# Patient Record
Sex: Female | Born: 1965 | Race: Black or African American | Hispanic: No | Smoking: Never smoker
Health system: Southern US, Community
[De-identification: ages and names within clinical notes are randomized; demographics above are authoritative.]

## PROBLEM LIST (undated history)

## (undated) DIAGNOSIS — K219 Gastro-esophageal reflux disease without esophagitis: Secondary | ICD-10-CM

## (undated) HISTORY — PX: WRIST SURGERY: SHX841

## (undated) HISTORY — PX: TUBAL LIGATION: SHX77

---

## 1998-06-10 ENCOUNTER — Emergency Department (HOSPITAL_COMMUNITY): Admission: EM | Admit: 1998-06-10 | Discharge: 1998-06-10 | Payer: Self-pay | Admitting: Emergency Medicine

## 1999-03-04 ENCOUNTER — Encounter: Payer: Self-pay | Admitting: Emergency Medicine

## 1999-03-04 ENCOUNTER — Emergency Department (HOSPITAL_COMMUNITY): Admission: EM | Admit: 1999-03-04 | Discharge: 1999-03-04 | Payer: Self-pay | Admitting: Emergency Medicine

## 2001-02-22 ENCOUNTER — Encounter: Payer: Self-pay | Admitting: Emergency Medicine

## 2001-02-22 ENCOUNTER — Emergency Department (HOSPITAL_COMMUNITY): Admission: EM | Admit: 2001-02-22 | Discharge: 2001-02-22 | Payer: Self-pay | Admitting: Emergency Medicine

## 2003-10-21 ENCOUNTER — Emergency Department (HOSPITAL_COMMUNITY): Admission: EM | Admit: 2003-10-21 | Discharge: 2003-10-21 | Payer: Self-pay | Admitting: Emergency Medicine

## 2004-02-29 ENCOUNTER — Emergency Department (HOSPITAL_COMMUNITY): Admission: EM | Admit: 2004-02-29 | Discharge: 2004-02-29 | Payer: Self-pay | Admitting: Emergency Medicine

## 2004-10-25 ENCOUNTER — Emergency Department (HOSPITAL_COMMUNITY): Admission: EM | Admit: 2004-10-25 | Discharge: 2004-10-25 | Payer: Self-pay | Admitting: Emergency Medicine

## 2005-11-13 ENCOUNTER — Emergency Department (HOSPITAL_COMMUNITY): Admission: EM | Admit: 2005-11-13 | Discharge: 2005-11-13 | Payer: Self-pay | Admitting: Emergency Medicine

## 2010-05-11 ENCOUNTER — Emergency Department (HOSPITAL_COMMUNITY): Admission: EM | Admit: 2010-05-11 | Discharge: 2010-05-11 | Payer: Self-pay | Admitting: Emergency Medicine

## 2011-02-23 LAB — CBC
MCHC: 34 g/dL (ref 30.0–36.0)
MCV: 82.7 fL (ref 78.0–100.0)
Platelets: 195 10*3/uL (ref 150–400)
RBC: 4.26 MIL/uL (ref 3.87–5.11)

## 2011-02-23 LAB — DIFFERENTIAL
Lymphs Abs: 4.1 10*3/uL — ABNORMAL HIGH (ref 0.7–4.0)
Monocytes Absolute: 0.5 10*3/uL (ref 0.1–1.0)
Monocytes Relative: 5 % (ref 3–12)
Neutrophils Relative %: 49 % (ref 43–77)

## 2011-02-23 LAB — URINALYSIS, ROUTINE W REFLEX MICROSCOPIC
Protein, ur: NEGATIVE mg/dL
Specific Gravity, Urine: >1.03 — ABNORMAL HIGH (ref 1.005–1.030)
Urobilinogen, UA: 0.2 mg/dL (ref 0.0–1.0)

## 2011-02-23 LAB — URINE MICROSCOPIC-ADD ON

## 2011-02-23 LAB — BASIC METABOLIC PANEL
BUN: 12 mg/dL (ref 6–23)
CO2: 24 mEq/L (ref 19–32)
Calcium: 8.6 mg/dL (ref 8.4–10.5)
GFR calc Af Amer: >60 mL/min (ref >60)
GFR calc non Af Amer: >60 mL/min (ref >60)

## 2011-02-23 LAB — POCT CARDIAC MARKERS: CKMB, poc: <1 ng/mL — ABNORMAL LOW (ref 1.0–8.0)

## 2011-11-02 ENCOUNTER — Emergency Department (HOSPITAL_COMMUNITY)
Admission: EM | Admit: 2011-11-02 | Discharge: 2011-11-03 | Disposition: A | Discharge status: Home / Self Care | Payer: Self-pay | Attending: Emergency Medicine | Admitting: Emergency Medicine

## 2011-11-02 ENCOUNTER — Emergency Department (HOSPITAL_COMMUNITY): Payer: Self-pay

## 2011-11-02 DIAGNOSIS — R0789 Other chest pain: Secondary | ICD-10-CM

## 2011-11-02 DIAGNOSIS — R071 Chest pain on breathing: Secondary | ICD-10-CM | POA: Insufficient documentation

## 2011-11-02 LAB — BASIC METABOLIC PANEL
BUN: 7 mg/dL (ref 6–23)
Glucose, Bld: 102 mg/dL — ABNORMAL HIGH (ref 70–99)
Sodium: 138 mEq/L (ref 135–145)

## 2011-11-02 LAB — DIFFERENTIAL
Eosinophils Absolute: 0.2 10*3/uL (ref 0.0–0.7)
Eosinophils Relative: 2 % (ref 0–5)
Lymphocytes Relative: 52 % — ABNORMAL HIGH (ref 12–46)
Lymphs Abs: 5.5 10*3/uL — ABNORMAL HIGH (ref 0.7–4.0)
Monocytes Absolute: 0.6 10*3/uL (ref 0.1–1.0)
Neutro Abs: 4.2 10*3/uL (ref 1.7–7.7)

## 2011-11-02 LAB — CBC
Hemoglobin: 11.6 g/dL — ABNORMAL LOW (ref 12.0–15.0)
MCH: 26.7 pg (ref 26.0–34.0)
MCV: 82.3 fL (ref 78.0–100.0)
Platelets: 180 10*3/uL (ref 150–400)
RDW: 14.9 % (ref 11.5–15.5)
WBC: 10.5 10*3/uL (ref 4.0–10.5)

## 2011-11-02 MED ORDER — KETOROLAC TROMETHAMINE 30 MG/ML IJ SOLN
30.0000 mg | Freq: Once | INTRAMUSCULAR | Status: AC
  Administered 2011-11-02: 30 mg via INTRAVENOUS
  Filled 2011-11-02: qty 1

## 2011-11-02 MED ORDER — ONDANSETRON HCL 4 MG/2ML IJ SOLN
4.0000 mg | Freq: Once | INTRAMUSCULAR | Status: AC
  Administered 2011-11-02: 4 mg via INTRAVENOUS
  Filled 2011-11-02: qty 2

## 2011-11-02 MED ORDER — FENTANYL CITRATE 0.05 MG/ML IJ SOLN
50.0000 ug | Freq: Once | INTRAMUSCULAR | Status: AC
  Administered 2011-11-02: 50 ug via INTRAVENOUS
  Filled 2011-11-02: qty 2

## 2011-11-02 MED ORDER — SODIUM CHLORIDE 0.9 % IV BOLUS (SEPSIS): 1000.0000 mL | Freq: Once | INTRAVENOUS | Status: DC

## 2011-11-02 NOTE — ED Provider Notes (Signed)
Scribed for Sunnie Nielsen, MD, the patient was seen in room APA05/[facility]05 . This chart was scribed by Ellie Lunch.   CSN: [id number] Arrival date & time: 11/02/2011 10:29 PM   First MD Initiated Contact with Patient 11/02/11 2304      Chief Complaint  Patient presents with  . Chest Pain    (Consider location/radiation/quality/duration/timing/severity/associated sxs/prior treatment) HPI Pt seen at 11:12 PM Jessica Johns is a 45 y.o. female who presents to the Emergency Department complaining of 2 days of sudden onset and gradually worsening chest pain. Pain began after Pt's father passed away on 05/08/2023 and has been constant since onset. Pain is described as a sharp, pulling pain. Pain is located under left breast. Pain is associated with SOB and nausea. Pt denies diaphoresis, leg pain or swelling. Pt treated with 2 Ib profen about 5 hours ago with mild improvement. Pt denies any recent or trauma or injury to chest. Pt denies h/o of heart issues. Pt has family history of heart issues.   History reviewed. No pertinent past medical history.  History reviewed. No pertinent past surgical history.  No family history on file.  History  Substance Use Topics  . Smoking status: Never Smoker   . Smokeless tobacco: Not on file  . Alcohol Use: No    Review of Systems 10 Systems reviewed and are negative for acute change except as noted in the HPI.   Allergies  Review of patient's allergies indicates no known allergies.  Home Medications   Current Outpatient Rx  Name Route Sig Dispense Refill  . IBUPROFEN 200 MG PO TABS Oral Take 400 mg by mouth once as needed. For pain       BP 133/65  Pulse 92  Temp(Src) 98.5 F (36.9 C) (Oral)  Resp 22  Ht 5' (1.524 m)  Wt 220 lb (99.791 kg)  BMI 42.97 kg/m2  SpO2 100%  LMP 10/26/2011  Physical Exam  Nursing note and vitals reviewed. Constitutional: She is oriented to person, place, and time. She appears well-developed and  well-nourished.  HENT:  Head: Normocephalic and atraumatic.  Eyes: Conjunctivae and EOM are normal.  Neck: Neck supple.  Cardiovascular: Normal rate and regular rhythm.   Pulmonary/Chest: Effort normal. No respiratory distress.       reproducible left chest wall tenderness  Abdominal: Soft. There is no tenderness.  Musculoskeletal:       Calves nontender.  Negative holman sign No chords.   Neurological: She is alert and oriented to person, place, and time.  Skin: Skin is warm and dry.  Psychiatric: She has a normal mood and affect.    ED Course  Procedures (including critical care time)  Labs Reviewed  BASIC METABOLIC PANEL - Abnormal; Notable for the following:    Glucose, Bld 102 (*)    All other components within normal limits  CBC  DIFFERENTIAL  I-STAT TROPONIN I  D-DIMER, QUANTITATIVE   Dg Chest Portable 1 View  11/02/2011  *RADIOLOGY REPORT*  Clinical Data: Chest pain.  PORTABLE CHEST - 1 VIEW  Comparison: 05/11/2010  Findings: The heart size is normal.  No edema or infiltrates.  No visualized pleural effusion.  The bony thorax is within normal limits.  IMPRESSION: No active disease.  Original Report Authenticated By: Reola Calkins, M.D.   ED MEDICATIONS Medications  fentaNYL (SUBLIMAZE) injection 50 mcg   sodium chloride 0.9 % bolus 1,000 mL   ketorolac (TORADOL) 30 MG/ML injection 30 mg (30 mg Intravenous Given 11/02/11 2348)  ondansetron [facility]) injection 4 mg (4 mg Intravenous Given 11/02/11 2347)    Nursing notes reviewed.  Pulse ox adequate room air  Chest x-ray and labs reviewed as above.   Date: 11/03/2011  Rate: 87  Rhythm: normal sinus rhythm  QRS Axis: normal  Intervals: normal  ST/T Wave abnormalities: nonspecific ST changes  Conduction Disutrbances:none  Narrative Interpretation: Baseline wander present  Old EKG Reviewed: none available    MDM  Presentation suggests chest wall pain with reproducible tenderness. Screening EKG  reviewed. Workup as above does not reveal other etiologies for symptoms. Patient still for discharge home with followup primary care. Patient states understanding strict return precautions for any worsening condition.   I personally performed the services described in this documentation, which was scribed in my presence. The recorded information has been reviewed and considered.          Sunnie Nielsen, MD 11/03/11 [phone]

## 2011-11-02 NOTE — ED Notes (Signed)
Began having cp after father passed away on sat, pain worse tonight.

## 2011-11-02 NOTE — ED Notes (Signed)
Pain medications given as ordered.  Pt rates chest pain 8/10 at this time. Family in room. No further needs voiced. Will continue to monitor.

## 2011-11-03 LAB — POCT I-STAT TROPONIN I

## 2011-11-03 MED ORDER — IBUPROFEN 800 MG PO TABS: 800.0000 mg | ORAL_TABLET | Freq: Three times a day (TID) | ORAL | Status: AC

## 2011-11-03 MED ORDER — FENTANYL CITRATE 0.05 MG/ML IJ SOLN
50.0000 ug | Freq: Once | INTRAMUSCULAR | Status: AC
  Administered 2011-11-03: 50 ug via INTRAVENOUS
  Filled 2011-11-03: qty 2

## 2011-11-03 MED ORDER — HYDROCODONE-ACETAMINOPHEN 7.5-500 MG/15ML PO SOLN: 7.5000 mL | Freq: Three times a day (TID) | ORAL | Status: AC | PRN

## 2011-11-03 NOTE — ED Notes (Signed)
Pt called to room and states chest pain is "sharp and worse" rating pain 10/10.  MD informed.  New orders received for Fentanyl IV and to repeat Troponin in three hours from first.

## 2011-11-03 NOTE — ED Notes (Signed)
Pt states pain is better rating pain 7/10. No needs voiced. Family remains at bedside.

## 2013-03-17 ENCOUNTER — Encounter (HOSPITAL_COMMUNITY): Payer: Self-pay | Admitting: *Deleted

## 2013-03-17 ENCOUNTER — Inpatient Hospital Stay (HOSPITAL_COMMUNITY)
Admission: EM | Admit: 2013-03-17 | Discharge: 2013-03-24 | DRG: 863 | Disposition: A | Discharge status: Home / Self Care | Payer: Medicaid Other | Attending: Internal Medicine | Admitting: Internal Medicine

## 2013-03-17 ENCOUNTER — Emergency Department (HOSPITAL_COMMUNITY): Payer: Medicaid Other

## 2013-03-17 DIAGNOSIS — L03211 Cellulitis of face: Secondary | ICD-10-CM | POA: Diagnosis present

## 2013-03-17 DIAGNOSIS — L0201 Cutaneous abscess of face: Secondary | ICD-10-CM | POA: Diagnosis present

## 2013-03-17 DIAGNOSIS — Z833 Family history of diabetes mellitus: Secondary | ICD-10-CM

## 2013-03-17 DIAGNOSIS — T8140XA Infection following a procedure, unspecified, initial encounter: Principal | ICD-10-CM | POA: Diagnosis present

## 2013-03-17 DIAGNOSIS — R110 Nausea: Secondary | ICD-10-CM | POA: Diagnosis not present

## 2013-03-17 DIAGNOSIS — Z6841 Body Mass Index (BMI) 40.0 and over, adult: Secondary | ICD-10-CM

## 2013-03-17 DIAGNOSIS — E876 Hypokalemia: Secondary | ICD-10-CM | POA: Diagnosis present

## 2013-03-17 DIAGNOSIS — E669 Obesity, unspecified: Secondary | ICD-10-CM | POA: Diagnosis present

## 2013-03-17 DIAGNOSIS — R259 Unspecified abnormal involuntary movements: Secondary | ICD-10-CM | POA: Diagnosis present

## 2013-03-17 DIAGNOSIS — Y838 Other surgical procedures as the cause of abnormal reaction of the patient, or of later complication, without mention of misadventure at the time of the procedure: Secondary | ICD-10-CM | POA: Diagnosis present

## 2013-03-17 DIAGNOSIS — R1310 Dysphagia, unspecified: Secondary | ICD-10-CM | POA: Diagnosis present

## 2013-03-17 DIAGNOSIS — R609 Edema, unspecified: Secondary | ICD-10-CM | POA: Diagnosis not present

## 2013-03-17 DIAGNOSIS — K08409 Partial loss of teeth, unspecified cause, unspecified class: Secondary | ICD-10-CM | POA: Diagnosis present

## 2013-03-17 LAB — POCT I-STAT, CHEM 8
BUN: 9 mg/dL (ref 6–23)
Calcium, Ion: 1.13 mmol/L (ref 1.12–1.23)
Chloride: 108 mEq/L (ref 96–112)
Glucose, Bld: 96 mg/dL (ref 70–99)
TCO2: 29 mmol/L (ref 0–100)

## 2013-03-17 LAB — CBC WITH DIFFERENTIAL/PLATELET
Eosinophils Relative: 1 % (ref 0–5)
HCT: 31.6 % — ABNORMAL LOW (ref 36.0–46.0)
Hemoglobin: 10.7 g/dL — ABNORMAL LOW (ref 12.0–15.0)
Lymphocytes Relative: 32 % (ref 12–46)
MCHC: 33.9 g/dL (ref 30.0–36.0)
MCV: 79.6 fL (ref 78.0–100.0)
Monocytes Absolute: 0.6 10*3/uL (ref 0.1–1.0)
Monocytes Relative: 6 % (ref 3–12)
Neutro Abs: 6.8 10*3/uL (ref 1.7–7.7)
RDW: 15.8 % — ABNORMAL HIGH (ref 11.5–15.5)
WBC: 11.2 10*3/uL — ABNORMAL HIGH (ref 4.0–10.5)

## 2013-03-17 MED ORDER — HYDROMORPHONE HCL PF 1 MG/ML IJ SOLN
1.0000 mg | INTRAMUSCULAR | Status: DC | PRN
  Administered 2013-03-17: 1 mg via INTRAVENOUS
  Filled 2013-03-17: qty 1

## 2013-03-17 MED ORDER — ONDANSETRON HCL 4 MG/2ML IJ SOLN
4.0000 mg | Freq: Four times a day (QID) | INTRAMUSCULAR | Status: DC | PRN
  Administered 2013-03-17 – 2013-03-19 (×5): 4 mg via INTRAVENOUS
  Filled 2013-03-17 (×5): qty 2

## 2013-03-17 MED ORDER — HYDROMORPHONE HCL PF 1 MG/ML IJ SOLN
1.0000 mg | Freq: Once | INTRAMUSCULAR | Status: AC
  Administered 2013-03-17: 1 mg via INTRAVENOUS
  Filled 2013-03-17: qty 1

## 2013-03-17 MED ORDER — SODIUM CHLORIDE 0.9 % IV SOLN
3.0000 g | Freq: Four times a day (QID) | INTRAVENOUS | Status: DC
  Administered 2013-03-17 – 2013-03-23 (×23): 3 g via INTRAVENOUS
  Filled 2013-03-17 (×23): qty 3

## 2013-03-17 MED ORDER — HYDROMORPHONE HCL PF 1 MG/ML IJ SOLN
1.0000 mg | INTRAMUSCULAR | Status: DC | PRN
  Administered 2013-03-17: 1 mg via INTRAVENOUS
  Administered 2013-03-17 – 2013-03-18 (×3): 2 mg via INTRAVENOUS
  Administered 2013-03-18: 1 mg via INTRAVENOUS
  Administered 2013-03-18: 2 mg via INTRAVENOUS
  Administered 2013-03-19 (×2): 1 mg via INTRAVENOUS
  Filled 2013-03-17: qty 1
  Filled 2013-03-17 (×2): qty 2
  Filled 2013-03-17: qty 1
  Filled 2013-03-17: qty 2
  Filled 2013-03-17 (×2): qty 1
  Filled 2013-03-17: qty 2

## 2013-03-17 MED ORDER — KETOROLAC TROMETHAMINE 30 MG/ML IJ SOLN
30.0000 mg | Freq: Four times a day (QID) | INTRAMUSCULAR | Status: AC | PRN
  Administered 2013-03-17 – 2013-03-22 (×15): 30 mg via INTRAVENOUS
  Filled 2013-03-17 (×15): qty 1

## 2013-03-17 MED ORDER — SODIUM CHLORIDE 0.9 % IV SOLN
3.0000 g | Freq: Once | INTRAVENOUS | Status: AC
  Administered 2013-03-17: 3 g via INTRAVENOUS
  Filled 2013-03-17: qty 3

## 2013-03-17 MED ORDER — POTASSIUM CHLORIDE IN NACL 40-0.9 MEQ/L-% IV SOLN
INTRAVENOUS | Status: DC
  Administered 2013-03-17 – 2013-03-22 (×6): via INTRAVENOUS

## 2013-03-17 MED ORDER — ONDANSETRON HCL 4 MG PO TABS
4.0000 mg | ORAL_TABLET | Freq: Four times a day (QID) | ORAL | Status: DC | PRN
  Filled 2013-03-17: qty 1

## 2013-03-17 MED ORDER — IOHEXOL 300 MG/ML  SOLN
75.0000 mL | Freq: Once | INTRAMUSCULAR | Status: AC | PRN
  Administered 2013-03-17: 75 mL via INTRAVENOUS

## 2013-03-17 MED ORDER — HEPARIN SODIUM (PORCINE) 5000 UNIT/ML IJ SOLN
5000.0000 [IU] | Freq: Three times a day (TID) | INTRAMUSCULAR | Status: DC
  Administered 2013-03-17 – 2013-03-24 (×20): 5000 [IU] via SUBCUTANEOUS
  Filled 2013-03-17 (×21): qty 1

## 2013-03-17 MED ORDER — ONDANSETRON HCL 4 MG/2ML IJ SOLN
4.0000 mg | Freq: Once | INTRAMUSCULAR | Status: AC
  Administered 2013-03-17: 4 mg via INTRAVENOUS
  Filled 2013-03-17: qty 2

## 2013-03-17 NOTE — H&P (Signed)
Hospital Admission Note Date: 03/17/2013  Patient name: Jessica Johns Medical record number: [id number] Date of birth: August 28, 1966 Age: 47 y.o. Gender: female [facility]CP: [facility]  Attending physician: Christiane Ha, MD  Chief Complaint: facial pain and swelling  History of Present Illness:  Jessica Johns is an 47 y.o. female who presents with worsening right face and neck swelling.  She had 4 wisdom teeth extracted 2 days ago. The swelling became much worse today. She is unable to handle her own saliva, certainly difficulty swallowing pills. She's had no fevers or chills. No shortness of breath. CT of the face and neck show no abscess. The pain has become very severe, that she has had to alternate Vicodin and ibuprofen, though has been unable take anything today.  History reviewed. No pertinent past medical history.  Meds: Vicodin, ibuprofen  Allergies: Review of patient's allergies indicates no known allergies. History   Social History  . Marital Status: Single    Spouse Name: N/A    Number of Children: N/A  . Years of Education: N/A   Occupational History  . Not on file.   Social History Main Topics  . Smoking status: Never Smoker   . Smokeless tobacco: Not on file  . Alcohol Use: No  . Drug Use: No  . Sexually Active: Yes    Birth Control/ Protection: None   Other Topics Concern  . Not on file   Social History Narrative  . No narrative on file   FH:  Both parents have diabetes  Past Surgical History  Procedure Laterality Date  . Tubal ligation      Review of Systems: Systems reviewed and as per HPI, otherwise negative.  Physical Exam: Blood pressure 124/71, pulse 91, temperature 98.9 F (37.2 C), temperature source Oral, resp. rate 18, height 5' (1.524 m), weight 102.059 kg (225 lb), last menstrual period 02/27/2013, SpO2 99.00%. BP 124/71  Pulse 91  Temp(Src) 98.9 F (37.2 C) (Oral)  Resp 18  Ht 5' (1.524 m)  Wt 102.059 kg (225 lb)  BMI  43.94 kg/m2  SpO2 99%  LMP 02/27/2013  General Appearance:    Alert, uncomfortable appearing   Head:    Normocephalic, without obvious abnormality, atraumatic.right face and neck are indurated, exquisitely tender and slightly erythematous   Eyes:    PERRL, conjunctiva/corneas clear, EOM's intact, fundi    benign, both eyes  Ears:    Normal TM's and external ear canals, both ears  Nose:   Nares normal, septum midline, mucosa normal, no drainage    or sinus tenderness  Throat:   moist mucous membranes. Patient is unable to open her mouth wide enough to fully examine all teeth and oropharynx. No blood or drainage noted   Neck:   Supple, symmetrical, trachea midline, no adenopathy;    thyroid:  no enlargement/tenderness/nodules; no carotid   bruit or JVD  Back:     Symmetric, no curvature, ROM normal, no CVA tenderness  Lungs:     Clear to auscultation bilaterally, respirations unlabored  Chest Wall:    No tenderness or deformity   Heart:    Regular rate and rhythm, S1 and S2 normal, no murmur, rub   or gallop     Abdomen:     Soft, non-tender, bowel sounds active all four quadrants,    no masses, no organomegaly        Extremities:   Extremities normal, atraumatic, no cyanosis or edema  Pulses:   2+ and symmetric  all extremities  Skin:   Skin color, texture, turgor normal, no rashes or lesions  Lymph nodes:   Cervical, supraclavicular, and axillary nodes normal  Neurologic:   CNII-XII intact, normal strength, sensation and reflexes    throughout     Psychiatric: Anxious appearing  Lab results: Basic Metabolic Panel:  Recent Labs  81/19/14 1202  NA 140  K 3.4*  CL 108  GLUCOSE 96  BUN 9  CREATININE 0.80   Liver Function Tests: No results found for this basename: AST, ALT, ALKPHOS, BILITOT, PROT, ALBUMIN,  in the last 72 hours No results found for this basename: LIPASE, AMYLASE,  in the last 72 hours No results found for this basename: AMMONIA,  in the last 72  hours CBC:  Recent Labs  03/17/13 1151 03/17/13 1202  WBC 11.2*  --   NEUTROABS 6.8  --   HGB 10.7* 11.9*  HCT 31.6* 35.0*  MCV 79.6  --   PLT 192  --    Imaging results:  Ct Soft Tissue Neck W Contrast  03/17/2013  *RADIOLOGY REPORT*  Clinical Data:  Right facial pain and swelling after tooth extraction  CT MAXILLOFACIAL WITH CONTRAST  Technique:  Multidetector CT imaging of the maxillofacial structure s was performed with intravenous contrast. Multiplanar CT image reconstructions were als o generated.  A small metallic BB was placed on the right temple in order to reliably differentiate right from left.  Contrast: 75mL OMNIPAQUE IOHEXOL 300 MG/ML  SOLN  Comparison:   None.  Findings:  Sinuses, middle ears and mastoids are clear.  There is evidence of recent extraction of all four wisdom teeth.  There is no bony complications seen relative to that on either side.  There is no discernible facial swelling on the left.  On the right, there is a nonspecific swelling and edema of the soft tissues of the right face consistent with cellulitis.  No evidence of a drainable abscess at this moment.  Submandibular lymph nodes are slightly enlarged consistent with reactive change.  No suppuration.  No deep space involvement is identified.  IMPRESSION: Recent extraction of all four wisdom tooth.  Soft tissue swelling of the right side of the face consistent with cellulitis.  No evidence of drainable abscess.  CT NECK WITH CONTRAST  Technique:  Multidetector CT imaging of the neck was performed using the standard protocol following the bolus administration of intravenous contrast.  Contrast: 75mL OMNIPAQUE IOHEXOL 300 MG/ML  SOLN  Comparison:   None  Findings: Below the region of facial swelling on the right related to the recent tooth extraction and felt to be consistent with cellulitis, there is no worrisome finding in the neck.  Images in the lower neck are markedly degraded because of the patient's size. No  upper mediastinal pathology is seen.  No evidence of suppurative lymph nodes.  No extension of inflammatory changes into the neck proper.  Thyroid gland appears unremarkable.  Vascular structures are unremarkable.  IMPRESSION: No extension of the right face inflammatory process into the neck proper.   Original Report Authenticated By: Paulina Fusi, M.D.    Ct Maxillofacial W/cm  03/17/2013  *RADIOLOGY REPORT*  Clinical Data:  Right facial pain and swelling after tooth extraction  CT MAXILLOFACIAL WITH CONTRAST  Technique:  Multidetector CT imaging of the maxillofacial structure s was performed with intravenous contrast. Multiplanar CT image reconstructions were als o generated.  A small metallic BB was placed on the right temple in order to reliably differentiate right  from left.  Contrast: 75mL OMNIPAQUE IOHEXOL 300 MG/ML  SOLN  Comparison:   None.  Findings:  Sinuses, middle ears and mastoids are clear.  There is evidence of recent extraction of all four wisdom teeth.  There is no bony complications seen relative to that on either side.  There is no discernible facial swelling on the left.  On the right, there is a nonspecific swelling and edema of the soft tissues of the right face consistent with cellulitis.  No evidence of a drainable abscess at this moment.  Submandibular lymph nodes are slightly enlarged consistent with reactive change.  No suppuration.  No deep space involvement is identified.  IMPRESSION: Recent extraction of all four wisdom tooth.  Soft tissue swelling of the right side of the face consistent with cellulitis.  No evidence of drainable abscess.  CT NECK WITH CONTRAST  Technique:  Multidetector CT imaging of the neck was performed using the standard protocol following the bolus administration of intravenous contrast.  Contrast: 75mL OMNIPAQUE IOHEXOL 300 MG/ML  SOLN  Comparison:   None  Findings: Below the region of facial swelling on the right related to the recent tooth extraction and  felt to be consistent with cellulitis, there is no worrisome finding in the neck.  Images in the lower neck are markedly degraded because of the patient's size. No upper mediastinal pathology is seen.  No evidence of suppurative lymph nodes.  No extension of inflammatory changes into the neck proper.  Thyroid gland appears unremarkable.  Vascular structures are unremarkable.  IMPRESSION: No extension of the right face inflammatory process into the neck proper.   Original Report Authenticated By: Paulina Fusi, M.D.     Assessment & Plan: Principal Problem:   Facial cellulitis Active Problems:   S/P tooth extraction   Hypokalemia  As patient is unable to take anything by mouth, she will be admitted for IV antibiotics, pain control and observation. Replete potassium IV. For now ice chips and sips of liquids.  Legacy Carrender L 03/17/2013, 3:05 PM

## 2013-03-17 NOTE — ED Notes (Signed)
Pt had all four wisdom teeth pulled 2 days ago; significant swelling and pain to right jaw and face; c/o pain radiating to ear and difficulty swallowing at times

## 2013-03-17 NOTE — ED Provider Notes (Signed)
History     CSN: [id number]  Arrival date & time 03/17/13  1116   First MD Initiated Contact with Patient 03/17/13 1128      Chief Complaint  Patient presents with  . Dental Pain  . Facial Swelling     Patient is a 47 y.o. female presenting with tooth pain. The history is provided by the patient.  Dental PainThe primary symptoms include mouth pain. Primary symptoms do not include fever. The symptoms began 2 days ago. The symptoms are worsening. The symptoms are new. The symptoms occur constantly.  Additional symptoms include: trismus and facial swelling. Additional symptoms do not include: drooling.  nothing improves her symptoms Palpation of face worsens pain  Pt presents with right facial swelling She reports wisdom teeth extraction 2 days ago.  She had some swelling postop, but then this morning she woke up with worsened pain/swelling.  She reports it is difficult to open mouth.  She is having some difficulty swallowing  PMH - none  Past Surgical History  Procedure Laterality Date  . Tubal ligation      No family history on file.  History  Substance Use Topics  . Smoking status: Never Smoker   . Smokeless tobacco: Not on file  . Alcohol Use: No    OB History   Grav Para Term Preterm Abortions TAB SAB Ect Mult Living                  Review of Systems  Constitutional: Negative for fever.  HENT: Positive for facial swelling and dental problem. Negative for drooling and neck stiffness.   Eyes: Negative for visual disturbance.  Respiratory: Negative for chest tightness.   Cardiovascular: Negative for chest pain.  Gastrointestinal: Negative for abdominal pain.  Musculoskeletal: Negative for back pain.  Neurological: Negative for weakness.  Psychiatric/Behavioral: Negative for agitation.  All other systems reviewed and are negative.    Allergies  Review of patient's allergies indicates no known allergies.  Home Medications   Current Outpatient Rx  Name   Route  Sig  Dispense  Refill  . ibuprofen (ADVIL,MOTRIN) 200 MG tablet   Oral   Take 400 mg by mouth once as needed. For pain            BP 127/64  Pulse 95  Temp(Src) 98.9 F (37.2 C) (Oral)  Resp 20  Ht 5' (1.524 m)  Wt 225 lb (102.059 kg)  BMI 43.94 kg/m2  SpO2 98%  LMP 02/27/2013  Physical Exam CONSTITUTIONAL: Well developed/well nourished HEAD: Normocephalic/atraumatic EYES: EOMI/PERRL, no proptosis ENMT: mild trismus noted.  Diffuse tenderness along gingiva of right mandible.  She has significant external facial swelling that starts around right mandible and extends towards maxilla.  She has some swelling below right mandible.  No crepitance.  No tenderness over anterior neck.   No stridor is noted NECK: supple no meningeal signs SPINE:entire spine nontender CV: S1/S2 noted, no murmurs/rubs/gallops noted LUNGS: Lungs are clear to auscultation bilaterally, no apparent distress ABDOMEN: soft, nontender, no rebound or guarding GU:no cva tenderness NEURO: Pt is awake/alert, moves all extremitiesx4 EXTREMITIES: pulses normal, full ROM SKIN: warm, color normal PSYCH: no abnormalities of mood noted  ED Course  Procedures   Labs Reviewed  CBC WITH DIFFERENTIAL   12:04 PM Pt is here for facial swelling since dental extraction by dr Barbette Merino in [place] Most of the swelling is on right mandibular region.  She can fully range neck and no stridor.  She is handling  secretions well Will obtain CT imaging.  IV antibiotics started 12:59 PM Pt reports she feels improved. CT pending at this time  2:21 PM I spoke to dr Barbette Merino, her oral surgeon We discussed CT findings.  If she can take PO she can be started on oral augmentin Pt still with pain on swallowing.  She is otherwise no distress.  Per CT imaging, cellulitis only, no identifiable abscess and no airway involvement.  Her tongue appears normal She will require further antibiotic dosing and concern that she will not be able to  take PO at home Will admit D/w dr Kerry Hough, will admit to hospital  MDM  Nursing notes including past medical history and social history reviewed and considered in documentation Labs/vital reviewed and considered         Joya Gaskins, MD 03/17/13 1424

## 2013-03-17 NOTE — ED Notes (Signed)
Pt c/o dental pain and right side facial swelling since yesterday. Pt had all 4 wisdom teeth removed on 4/9 and called surgeon after she noticed swelling. Pt states swelling became worse this morning and she was advised to come to ED for evaluation.

## 2013-03-18 DIAGNOSIS — L03211 Cellulitis of face: Secondary | ICD-10-CM

## 2013-03-18 DIAGNOSIS — K08409 Partial loss of teeth, unspecified cause, unspecified class: Secondary | ICD-10-CM

## 2013-03-18 DIAGNOSIS — E876 Hypokalemia: Secondary | ICD-10-CM

## 2013-03-18 DIAGNOSIS — K08109 Complete loss of teeth, unspecified cause, unspecified class: Secondary | ICD-10-CM

## 2013-03-18 NOTE — Progress Notes (Signed)
Subjective: Able to swallow small amounts of Jell-O and ginger ale mixed with water. Still having difficulty opening her mouth. Still with much pain. No difficulty breathing.  Objective: Vital signs in last 24 hours: Filed Vitals:   03/17/13 1123 03/17/13 1327 03/17/13 2230  BP: 127/64 124/71 118/81  Pulse: 95 91 88  Temp: 98.9 F (37.2 C)  97.9 F (36.6 C)  TempSrc: Oral    Resp: 20 18 20   Height: 5' (1.524 m)    Weight: 102.059 kg (225 lb)    SpO2: 98% 99% 100%   Weight change:   Intake/Output Summary (Last 24 hours) at 03/18/13 0858 Last data filed at 03/17/13 2300  Gross per 24 hour  Intake    400 ml  Output      0 ml  Net    400 ml   General: Uncomfortable. Nontoxic. HEENT: Right face and neck swelling about the same. Able to open her mouth slightly wider today. No fluctuance or drainage. Moist mucous membranes. Neck: No stridor Lungs clear to auscultation bilaterally without wheeze rhonchi or rales  Lab Results: Basic Metabolic Panel:  Recent Labs Lab 03/17/13 1202  NA 140  K 3.4*  CL 108  GLUCOSE 96  BUN 9  CREATININE 0.80   Liver Function Tests: No results found for this basename: AST, ALT, ALKPHOS, BILITOT, PROT, ALBUMIN,  in the last 168 hours No results found for this basename: LIPASE, AMYLASE,  in the last 168 hours No results found for this basename: AMMONIA,  in the last 168 hours CBC:  Recent Labs Lab 03/17/13 1151 03/17/13 1202  WBC 11.2*  --   NEUTROABS 6.8  --   HGB 10.7* 11.9*  HCT 31.6* 35.0*  MCV 79.6  --   PLT 192  --   Studies/Results: Ct Soft Tissue Neck W Contrast  03/17/2013  *RADIOLOGY REPORT*  Clinical Data:  Right facial pain and swelling after tooth extraction  CT MAXILLOFACIAL WITH CONTRAST  Technique:  Multidetector CT imaging of the maxillofacial structure s was performed with intravenous contrast. Multiplanar CT image reconstructions were als o generated.  A small metallic BB was placed on the right temple in order to  reliably differentiate right from left.  Contrast: 75mL OMNIPAQUE IOHEXOL 300 MG/ML  SOLN  Comparison:   None.  Findings:  Sinuses, middle ears and mastoids are clear.  There is evidence of recent extraction of all four wisdom teeth.  There is no bony complications seen relative to that on either side.  There is no discernible facial swelling on the left.  On the right, there is a nonspecific swelling and edema of the soft tissues of the right face consistent with cellulitis.  No evidence of a drainable abscess at this moment.  Submandibular lymph nodes are slightly enlarged consistent with reactive change.  No suppuration.  No deep space involvement is identified.  IMPRESSION: Recent extraction of all four wisdom tooth.  Soft tissue swelling of the right side of the face consistent with cellulitis.  No evidence of drainable abscess.  CT NECK WITH CONTRAST  Technique:  Multidetector CT imaging of the neck was performed using the standard protocol following the bolus administration of intravenous contrast.  Contrast: 75mL OMNIPAQUE IOHEXOL 300 MG/ML  SOLN  Comparison:   None  Findings: Below the region of facial swelling on the right related to the recent tooth extraction and felt to be consistent with cellulitis, there is no worrisome finding in the neck.  Images in the lower  neck are markedly degraded because of the patient's size. No upper mediastinal pathology is seen.  No evidence of suppurative lymph nodes.  No extension of inflammatory changes into the neck proper.  Thyroid gland appears unremarkable.  Vascular structures are unremarkable.  IMPRESSION: No extension of the right face inflammatory process into the neck proper.   Original Report Authenticated By: Paulina Fusi, M.D.    Ct Maxillofacial W/cm  03/17/2013  *RADIOLOGY REPORT*  Clinical Data:  Right facial pain and swelling after tooth extraction  CT MAXILLOFACIAL WITH CONTRAST  Technique:  Multidetector CT imaging of the maxillofacial structure s  was performed with intravenous contrast. Multiplanar CT image reconstructions were als o generated.  A small metallic BB was placed on the right temple in order to reliably differentiate right from left.  Contrast: 75mL OMNIPAQUE IOHEXOL 300 MG/ML  SOLN  Comparison:   None.  Findings:  Sinuses, middle ears and mastoids are clear.  There is evidence of recent extraction of all four wisdom teeth.  There is no bony complications seen relative to that on either side.  There is no discernible facial swelling on the left.  On the right, there is a nonspecific swelling and edema of the soft tissues of the right face consistent with cellulitis.  No evidence of a drainable abscess at this moment.  Submandibular lymph nodes are slightly enlarged consistent with reactive change.  No suppuration.  No deep space involvement is identified.  IMPRESSION: Recent extraction of all four wisdom tooth.  Soft tissue swelling of the right side of the face consistent with cellulitis.  No evidence of drainable abscess.  CT NECK WITH CONTRAST  Technique:  Multidetector CT imaging of the neck was performed using the standard protocol following the bolus administration of intravenous contrast.  Contrast: 75mL OMNIPAQUE IOHEXOL 300 MG/ML  SOLN  Comparison:   None  Findings: Below the region of facial swelling on the right related to the recent tooth extraction and felt to be consistent with cellulitis, there is no worrisome finding in the neck.  Images in the lower neck are markedly degraded because of the patient's size. No upper mediastinal pathology is seen.  No evidence of suppurative lymph nodes.  No extension of inflammatory changes into the neck proper.  Thyroid gland appears unremarkable.  Vascular structures are unremarkable.  IMPRESSION: No extension of the right face inflammatory process into the neck proper.   Original Report Authenticated By: Paulina Fusi, M.D.    Scheduled Meds: . ampicillin-sulbactam (UNASYN) IV  3 g  Intravenous Q6H  . heparin  5,000 Units Subcutaneous Q8H   Continuous Infusions: . 0.9 % NaCl with KCl 40 mEq / L 75 mL/hr at 03/17/13 1542   PRN Meds:.HYDROmorphone (DILAUDID) injection, ketorolac, ondansetron (ZOFRAN) IV, ondansetron Assessment/Plan: Principal Problem:   Facial cellulitis with significant trismus and difficulty swallowing. Continue Unasyn, NSAIDs and Dilaudid. Add ice packs. Slightly improved today. Discharge when able to swallow pills and keep hydrated. Changed to inpatient. Active Problems:   S/P tooth extraction, 4 wisdom teeth by Dr. Ocie Doyne on the ninth. He is aware of patient's admission per   Hypokalemia: Check labs in the morning the    LOS: 1 day   Braxtyn Dorff L 03/18/2013, 8:58 AM

## 2013-03-19 DIAGNOSIS — L0201 Cutaneous abscess of face: Secondary | ICD-10-CM

## 2013-03-19 DIAGNOSIS — L03211 Cellulitis of face: Secondary | ICD-10-CM

## 2013-03-19 LAB — CBC WITH DIFFERENTIAL/PLATELET
Basophils Absolute: 0 10*3/uL (ref 0.0–0.1)
Basophils Relative: 0 % (ref 0–1)
Eosinophils Absolute: 0.1 10*3/uL (ref 0.0–0.7)
Eosinophils Relative: 2 % (ref 0–5)
HCT: 32.6 % — ABNORMAL LOW (ref 36.0–46.0)
Hemoglobin: 10.7 g/dL — ABNORMAL LOW (ref 12.0–15.0)
Lymphocytes Relative: 46 % (ref 12–46)
Lymphs Abs: 3 10*3/uL (ref 0.7–4.0)
MCH: 26.9 pg (ref 26.0–34.0)
MCHC: 32.8 g/dL (ref 30.0–36.0)
MCV: 81.9 fL (ref 78.0–100.0)
Monocytes Absolute: 0.4 10*3/uL (ref 0.1–1.0)
Monocytes Relative: 5 % (ref 3–12)
Neutro Abs: 3 10*3/uL (ref 1.7–7.7)
Neutrophils Relative %: 46 % (ref 43–77)
Platelets: 208 10*3/uL (ref 150–400)
RBC: 3.98 MIL/uL (ref 3.87–5.11)
RDW: 15.7 % — ABNORMAL HIGH (ref 11.5–15.5)
WBC: 6.5 10*3/uL (ref 4.0–10.5)

## 2013-03-19 LAB — BASIC METABOLIC PANEL
Calcium: 8.4 mg/dL (ref 8.4–10.5)
GFR calc Af Amer: >90 mL/min (ref >90)
GFR calc non Af Amer: >90 mL/min (ref >90)
Potassium: 4 mEq/L (ref 3.5–5.1)
Sodium: 133 mEq/L — ABNORMAL LOW (ref 135–145)

## 2013-03-19 MED ORDER — DIPHENHYDRAMINE HCL 12.5 MG/5ML PO ELIX: 12.5000 mg | ORAL_SOLUTION | Freq: Four times a day (QID) | ORAL | Status: DC | PRN

## 2013-03-19 MED ORDER — ONDANSETRON HCL 4 MG/2ML IJ SOLN
4.0000 mg | INTRAMUSCULAR | Status: DC | PRN
  Administered 2013-03-19 – 2013-03-21 (×6): 4 mg via INTRAVENOUS
  Filled 2013-03-19 (×6): qty 2

## 2013-03-19 MED ORDER — SODIUM CHLORIDE 0.9 % IJ SOLN: 9.0000 mL | INTRAMUSCULAR | Status: DC | PRN

## 2013-03-19 MED ORDER — HYDROMORPHONE 0.3 MG/ML IV SOLN
INTRAVENOUS | Status: DC
  Administered 2013-03-19: 15:00:00 via INTRAVENOUS
  Administered 2013-03-19: 2.7 mg via INTRAVENOUS
  Administered 2013-03-19 – 2013-03-20 (×2): 1.8 mg via INTRAVENOUS
  Administered 2013-03-20: 03:00:00 via INTRAVENOUS
  Filled 2013-03-19 (×2): qty 25

## 2013-03-19 MED ORDER — NALOXONE HCL 0.4 MG/ML IJ SOLN: 0.4000 mg | INTRAMUSCULAR | Status: DC | PRN

## 2013-03-19 MED ORDER — ONDANSETRON HCL 4 MG PO TABS: 4.0000 mg | ORAL_TABLET | ORAL | Status: DC | PRN

## 2013-03-19 MED ORDER — DIPHENHYDRAMINE HCL 50 MG/ML IJ SOLN
12.5000 mg | Freq: Four times a day (QID) | INTRAMUSCULAR | Status: DC | PRN
  Administered 2013-03-19: 12.5 mg via INTRAVENOUS
  Filled 2013-03-19: qty 1

## 2013-03-19 NOTE — Progress Notes (Signed)
[facility] PROGRESS NOTE  Jessica Johns VHQ:[id number] DOB: Jul 30, 1966 DOA: 03/17/2013 PCP: No primary provider on file.  Assessment/Plan: Principal Problem:   Facial cellulitis: Secondary to recent tooth extraction. Continue IV antibiotics this medicine for pain and nausea. Active Problems:   S/P tooth extraction   Hypokalemia: Replaced Edema: Likely secondary to third spacing from poor by mouth intake  Code Status: Full code Family Communication: Plan discussed with patient at bedside. Will leave message for son. Disposition Plan: Home in a few days when able to eat   Consultants:  None  Procedures:  Status post dental extraction done on 4/9, prior to admission  Antibiotics:  Unasyn day 3  HPI/Subjective: Patient feeling minimally better today. Still having significant amount pain. Not able to swallow yet. It was able to take minimal amount of Jell-O.  Objective: Filed Vitals:   03/17/13 2230 03/18/13 1400 03/18/13 2030 03/18/13 2207  BP: 118/81 112/69  124/49  Pulse: 88 73 74 88  Temp: 97.9 F (36.6 C) 98.6 F (37 C)  98.6 F (37 C)  TempSrc:  Oral  Oral  Resp: 20 20 20 20   Height:      Weight:      SpO2: 100% 100% 99% 100%    Intake/Output Summary (Last 24 hours) at 03/19/13 1356 Last data filed at 03/18/13 1500  Gross per 24 hour  Intake   1200 ml  Output      0 ml  Net   1200 ml   Filed Weights   03/17/13 1123  Weight: 102.059 kg (225 lb)    Exam:   General:  Alert and oriented x3, mild distress secondary to pain, minimally able to open mouth  Cardiovascular: Regular rate and rhythm, S1-S2  Respiratory: Clear auscultation bilaterally  Abdomen: Soft, nontender, obese, positive bowel sounds  Musculoskeletal: Trace pitting edema lower extremities   Data Reviewed: Basic Metabolic Panel:  Recent Labs Lab 03/17/13 1202 03/19/13 0617  NA 140 133*  K 3.4* 4.0  CL 108 100  CO2  --  25  GLUCOSE 96 94  BUN 9 5*  CREATININE 0.80  0.64  CALCIUM  --  8.4   CBC:  Recent Labs Lab 03/17/13 1151 03/17/13 1202 03/19/13 0617  WBC 11.2*  --  6.5  NEUTROABS 6.8  --  3.0  HGB 10.7* 11.9* 10.7*  HCT 31.6* 35.0* 32.6*  MCV 79.6  --  81.9  PLT 192  --  208     Studies: No results found.  Scheduled Meds: . ampicillin-sulbactam (UNASYN) IV  3 g Intravenous Q6H  . heparin  5,000 Units Subcutaneous Q8H   Continuous Infusions: . 0.9 % NaCl with KCl 40 mEq / L 75 mL/hr at 03/19/13 0254    Principal Problem:   Facial cellulitis Active Problems:   S/P tooth extraction   Hypokalemia    Time spent: 20 minutes    Hollice Espy  [facility] Pager [phone]. If 7PM-7AM, please contact night-coverage at [email or web address], password [facility] 03/19/2013, 1:56 PM  LOS: 2 days

## 2013-03-20 MED ORDER — HYDROMORPHONE 0.3 MG/ML IV SOLN
INTRAVENOUS | Status: DC
  Administered 2013-03-21 (×2): 0.9 mg via INTRAVENOUS
  Administered 2013-03-21: 03:00:00 via INTRAVENOUS
  Filled 2013-03-20: qty 25

## 2013-03-20 MED ORDER — LIDOCAINE VISCOUS 2 % MT SOLN
20.0000 mL | Freq: Four times a day (QID) | OROMUCOSAL | Status: DC | PRN
Start: 2013-03-20 — End: 2013-03-24
  Administered 2013-03-20 – 2013-03-23 (×2): 20 mL via OROMUCOSAL
  Filled 2013-03-20: qty 30
  Filled 2013-03-20: qty 15

## 2013-03-20 MED ORDER — SODIUM CHLORIDE 0.9 % IJ SOLN: 9.0000 mL | INTRAMUSCULAR | Status: DC | PRN

## 2013-03-20 MED ORDER — NALOXONE HCL 0.4 MG/ML IJ SOLN: 0.4000 mg | INTRAMUSCULAR | Status: DC | PRN

## 2013-03-20 NOTE — Plan of Care (Signed)
Problem: Phase II Progression Outcomes Goal: Tolerating diet Outcome: Progressing Pt tolerating diet very poorly,MD ordered viscous lidocaine for oral pain control

## 2013-03-20 NOTE — Progress Notes (Signed)
UR Chart Review Completed  

## 2013-03-20 NOTE — Care Management Note (Signed)
    Page 1 of 1   03/23/2013     1:40:33 PM   CARE MANAGEMENT NOTE 03/23/2013  Patient:  Jessica Johns, Jessica Johns   Account Number:  [id number]  Date Initiated:  03/20/2013  Documentation initiated by:  Rosemary Holms  Subjective/Objective Assessment:   Pt admitted from home where she lives with her son. Due to facial cellulitis from tooth extraction, pt can not eat.     Action/Plan:   Anticipated DC Date:  03/23/2013   Anticipated DC Plan:  HOME/SELF CARE      DC Planning Services  CM consult      Choice offered to / List presented to:             Status of service:  Completed, signed off Medicare Important Message given?   (If response is "NO", the following Medicare IM given date fields will be blank) Date Medicare IM given:   Date Additional Medicare IM given:    Discharge Disposition:  HOME/SELF CARE  Per UR Regulation:    If discussed at Long Length of Stay Meetings, dates discussed:    Comments:  03/23/13 Rosemary Holms RN BSN CM Pt able to swallow medication per RN. Anticipate DC later today. No needs identified.  03/22/13 Rosemary Holms RN BSN CM Improving, hope DC tomorrow home  03/20/13 Rosemary Holms RN BSN CM

## 2013-03-20 NOTE — Progress Notes (Signed)
[facility] PROGRESS NOTE  Jessica Johns ZOX:[id number] DOB: 1966-02-19 DOA: 03/17/2013 PCP: No primary provider on file.  Assessment/Plan: Principal Problem:   Facial cellulitis: Secondary to recent tooth extraction. Continue IV antibiotics.  Swelling significantly reduced.  Dilaudid PCA started & did help with pain, but the strength contributing to increased nausea.  Active Problems:   S/P tooth extraction   Hypokalemia: Replaced Edema: Likely secondary to third spacing from poor by mouth intake  Code Status: Full code Family Communication: Plan discussed with patient at bedside. Will leave message for son. Disposition Plan: Home in a few days when able to eat   Consultants:  None  Procedures:  Status post dental extraction done on 4/9, prior to admission  Antibiotics:  Unasyn day 4  HPI/Subjective: Much less swelling.  Still not able to swallow.  Pain better controlled with PCA, but medicine causing sig nausea.  Objective: Filed Vitals:   03/19/13 2336 03/20/13 0230 03/20/13 0419 03/20/13 0700  BP:  122/70  115/75  Pulse:  73  71  Temp:  98 F (36.7 C)  98 F (36.7 C)  TempSrc:  Oral  Oral  Resp: 11 10 10    Height:      Weight:      SpO2: 100% 100% 100% 100%    Intake/Output Summary (Last 24 hours) at 03/20/13 0936 Last data filed at 03/20/13 0858  Gross per 24 hour  Intake     40 ml  Output      0 ml  Net     40 ml   Filed Weights   03/17/13 1123  Weight: 102.059 kg (225 lb)    Exam:   General:  Alert and oriented x3, mild distress secondary to pain, able to open mouth  Cardiovascular: Regular rate and rhythm, S1-S2  Respiratory: Clear auscultation bilaterally  Abdomen: Soft, nontender, obese, positive bowel sounds  Musculoskeletal: Trace pitting edema lower extremities   Data Reviewed: Basic Metabolic Panel:  Recent Labs Lab 03/17/13 1202 03/19/13 0617  NA 140 133*  K 3.4* 4.0  CL 108 100  CO2  --  25  GLUCOSE 96 94  BUN  9 5*  CREATININE 0.80 0.64  CALCIUM  --  8.4   CBC:  Recent Labs Lab 03/17/13 1151 03/17/13 1202 03/19/13 0617  WBC 11.2*  --  6.5  NEUTROABS 6.8  --  3.0  HGB 10.7* 11.9* 10.7*  HCT 31.6* 35.0* 32.6*  MCV 79.6  --  81.9  PLT 192  --  208     Studies: No results found.  Scheduled Meds: . ampicillin-sulbactam (UNASYN) IV  3 g Intravenous Q6H  . heparin  5,000 Units Subcutaneous Q8H  . HYDROmorphone PCA 0.3 mg/mL   Intravenous Q4H   Continuous Infusions: . 0.9 % NaCl with KCl 40 mEq / L 75 mL/hr at 03/19/13 0254    Principal Problem:   Facial cellulitis Active Problems:   S/P tooth extraction   Hypokalemia    Time spent: 20 minutes    Hollice Espy  [facility] Pager [phone]. If 7PM-7AM, please contact night-coverage at [email or web address], password [facility] 03/20/2013, 9:36 AM  LOS: 3 days

## 2013-03-21 ENCOUNTER — Inpatient Hospital Stay (HOSPITAL_COMMUNITY): Payer: Medicaid Other

## 2013-03-21 DIAGNOSIS — R110 Nausea: Secondary | ICD-10-CM | POA: Diagnosis not present

## 2013-03-21 DIAGNOSIS — E669 Obesity, unspecified: Secondary | ICD-10-CM | POA: Diagnosis present

## 2013-03-21 LAB — BASIC METABOLIC PANEL
BUN: 5 mg/dL — ABNORMAL LOW (ref 6–23)
Calcium: 8.9 mg/dL (ref 8.4–10.5)
Creatinine, Ser: 0.69 mg/dL (ref 0.50–1.10)
GFR calc Af Amer: >90 mL/min (ref >90)
GFR calc non Af Amer: >90 mL/min (ref >90)
Glucose, Bld: 96 mg/dL (ref 70–99)
Potassium: 3.5 mEq/L (ref 3.5–5.1)

## 2013-03-21 MED ORDER — DIPHENHYDRAMINE HCL 50 MG/ML IJ SOLN: 12.5000 mg | Freq: Four times a day (QID) | INTRAMUSCULAR | Status: DC | PRN

## 2013-03-21 MED ORDER — IOHEXOL 300 MG/ML  SOLN
75.0000 mL | Freq: Once | INTRAMUSCULAR | Status: AC | PRN
  Administered 2013-03-21: 75 mL via INTRAVENOUS

## 2013-03-21 MED ORDER — ONDANSETRON HCL 4 MG/2ML IJ SOLN
4.0000 mg | Freq: Four times a day (QID) | INTRAMUSCULAR | Status: DC | PRN
  Administered 2013-03-21 – 2013-03-22 (×3): 4 mg via INTRAVENOUS
  Filled 2013-03-21 (×3): qty 2

## 2013-03-21 MED ORDER — DIPHENHYDRAMINE HCL 12.5 MG/5ML PO ELIX: 12.5000 mg | ORAL_SOLUTION | Freq: Four times a day (QID) | ORAL | Status: DC | PRN

## 2013-03-21 MED ORDER — MORPHINE SULFATE (PF) 1 MG/ML IV SOLN
INTRAVENOUS | Status: DC
  Administered 2013-03-21 (×2): via INTRAVENOUS
  Administered 2013-03-21: 9 mg via INTRAVENOUS
  Administered 2013-03-22: 3 mg via INTRAVENOUS
  Filled 2013-03-21 (×2): qty 25

## 2013-03-21 MED ORDER — SODIUM CHLORIDE 0.9 % IJ SOLN: 9.0000 mL | INTRAMUSCULAR | Status: DC | PRN

## 2013-03-21 MED ORDER — NALOXONE HCL 0.4 MG/ML IJ SOLN: 0.4000 mg | INTRAMUSCULAR | Status: DC | PRN

## 2013-03-21 NOTE — Progress Notes (Signed)
[facility] PROGRESS NOTE  LAYANN BLUETT HQI:[id number] DOB: 1966-04-13 DOA: 03/17/2013 PCP: No primary provider on file.  Assessment/Plan: Principal Problem:   Facial cellulitis: Secondary to recent tooth extraction. Continue IV antibiotics.  A swelling itself appears to continue to improve. However, patient states today she is feeling worse. Increasing pain and decreasing ability to open her mouth. Still some painful swallowing. Dilaudid PCA decreased to reduced strength, which does help with pain-that gives her significant nausea. We'll check repeat CT maxillofacial.  Active Problems:   S/P tooth extraction   Hypokalemia: Replaced Edema: Likely secondary to third spacing from poor by mouth intake  Code Status: Full code Family Communication: Plan discussed with patient at bedside.  Disposition Plan: Home in a few days when able to eat   Consultants:  None  Procedures:  Status post dental extraction done on 4/9, prior to admission  Antibiotics:  Unasyn day 5  HPI/Subjective: However, patient states today she is feeling worse. Increasing pain and decreasing ability to open her mouth. Still some painful swallowing.  Objective: Filed Vitals:   03/21/13 0318 03/21/13 0430 03/21/13 0730 03/21/13 0916  BP:  162/76  146/74  Pulse:  71  74  Temp:  98.4 F (36.9 C)  98.7 F (37.1 C)  TempSrc:  Oral  Oral  Resp: 18 18 18 15   Height:      Weight:      SpO2: 100% 97% 97% 100%    Intake/Output Summary (Last 24 hours) at 03/21/13 1220 Last data filed at 03/21/13 1033  Gross per 24 hour  Intake    120 ml  Output      0 ml  Net    120 ml   Filed Weights   03/17/13 1123  Weight: 102.059 kg (225 lb)    Exam:   General:  Alert and oriented x3, mild distress secondary to pain, pain when trying to open her mouth. Not able to visualize tongue or mouth very well  Cardiovascular: Regular rate and rhythm, S1-S2  Respiratory: Clear auscultation  bilaterally  Abdomen: Soft, nontender, obese, positive bowel sounds  Musculoskeletal: Trace pitting edema lower extremities   Data Reviewed: Basic Metabolic Panel:  Recent Labs Lab 03/17/13 1202 03/19/13 0617 03/21/13 0505  NA 140 133* 137  K 3.4* 4.0 3.5  CL 108 100 101  CO2  --  25 26  GLUCOSE 96 94 96  BUN 9 5* 5*  CREATININE 0.80 0.64 0.69  CALCIUM  --  8.4 8.9   CBC:  Recent Labs Lab 03/17/13 1151 03/17/13 1202 03/19/13 0617  WBC 11.2*  --  6.5  NEUTROABS 6.8  --  3.0  HGB 10.7* 11.9* 10.7*  HCT 31.6* 35.0* 32.6*  MCV 79.6  --  81.9  PLT 192  --  208     Studies: No results found.  Scheduled Meds: . ampicillin-sulbactam (UNASYN) IV  3 g Intravenous Q6H  . heparin  5,000 Units Subcutaneous Q8H  . HYDROmorphone PCA 0.3 mg/mL   Intravenous Q4H   Continuous Infusions: . 0.9 % NaCl with KCl 40 mEq / L 75 mL/hr at 03/20/13 1144    Principal Problem:   Facial cellulitis Active Problems:   S/P tooth extraction   Hypokalemia    Time spent: 20 minutes    Hollice Espy  [facility] Pager [phone]. If 7PM-7AM, please contact night-coverage at [email or web address], password [facility] 03/21/2013, 12:20 PM  LOS: 4 days

## 2013-03-22 DIAGNOSIS — E669 Obesity, unspecified: Secondary | ICD-10-CM

## 2013-03-22 MED ORDER — OXYCODONE HCL 5 MG PO TABS
5.0000 mg | ORAL_TABLET | ORAL | Status: DC | PRN
  Administered 2013-03-22 – 2013-03-23 (×4): 5 mg via ORAL
  Filled 2013-03-22 (×7): qty 1

## 2013-03-22 MED ORDER — MORPHINE SULFATE 2 MG/ML IJ SOLN
2.0000 mg | INTRAMUSCULAR | Status: DC | PRN
  Administered 2013-03-22 – 2013-03-23 (×4): 2 mg via INTRAVENOUS
  Filled 2013-03-22 (×4): qty 1

## 2013-03-22 NOTE — Progress Notes (Signed)
     Subjective: This lady feels somewhat better today and her jaw is less swollen and painful. She is having difficulty opening her mouth to some degree. She is on a PCA pump!           Physical Exam: Blood pressure 95/55, pulse 74, temperature 97.9 F (36.6 C), temperature source Oral, resp. rate 13, height 5' (1.524 m), weight 102.059 kg (225 lb), last menstrual period 03/20/2013, SpO2 100.00%. She looks systemically well. Is not toxic or septic. Her right face is somewhat swollen and tender on palpation. Lung fields are clear. She is alert and orientated.   Investigations:     Basic Metabolic Panel:  Recent Labs  29/52/84 0505  NA 137  K 3.5  CL 101  CO2 26  GLUCOSE 96  BUN 5*  CREATININE 0.69  CALCIUM 8.9     Ct Maxillofacial W/cm  03/21/2013  *RADIOLOGY REPORT*  Clinical Data: Recent wisdom tooth extraction.  Facial cellulitis. On IV antibiotics.  CT MAXILLOFACIAL WITH CONTRAST  Technique:  Multidetector CT imaging of the maxillofacial structures was performed with intravenous contrast. Multiplanar CT image reconstructions were also generated.  Contrast: 75mL OMNIPAQUE IOHEXOL 300 MG/ML  SOLN  Comparison: 03/17/13  Findings: The patient has had recent extraction of all four wisdom teeth.  Fluid and air can be seen in the sockets of the mandible and maxilla.  There is improved air in the parasymphyseal region on the right adjacent to the mandible. No worrisome osseous findings. No paranasal sinus disease.  There is moderate cellulitis which appears slightly improved over the right face.  No submandibular or submental collection is seen.  Scattered reactive mild separate lymph nodes.  IMPRESSION: Overall slight improvement of right facial cellulitis. No visible abscess.   Original Report Authenticated By: Davonna Belling, M.D.       Medications: I have reviewed the patient's current medications.  Impression: 1. Right facial cellulitis, status post tooth extraction.  Improving. 2. Obesity.     Plan: 1. Discontinue morphine PCA pump. IV morphine when necessary and oxycodone orally as needed. 2. Encourage oral intake. 3. Mobilize.     LOS: 5 days   Wilson Singer Pager [phone]  03/22/2013, 9:56 AM

## 2013-03-22 NOTE — Plan of Care (Signed)
Problem: Phase II Progression Outcomes Goal: Tolerating diet Outcome: Progressing Pt stated that she was hungry today.  Requesting more substantial food other than liquid. Advanced to full liquid diet.

## 2013-03-23 LAB — CBC
MCH: 27.2 pg (ref 26.0–34.0)
MCHC: 33.8 g/dL (ref 30.0–36.0)
Platelets: 222 10*3/uL (ref 150–400)
RDW: 15.2 % (ref 11.5–15.5)

## 2013-03-23 LAB — COMPREHENSIVE METABOLIC PANEL
ALT: 19 U/L (ref 0–35)
Albumin: 3.1 g/dL — ABNORMAL LOW (ref 3.5–5.2)
Alkaline Phosphatase: 42 U/L (ref 39–117)
Calcium: 9.1 mg/dL (ref 8.4–10.5)
GFR calc Af Amer: >90 mL/min (ref >90)
Glucose, Bld: 89 mg/dL (ref 70–99)
Potassium: 3.8 mEq/L (ref 3.5–5.1)
Sodium: 138 mEq/L (ref 135–145)
Total Protein: 6.6 g/dL (ref 6.0–8.3)

## 2013-03-23 MED ORDER — ONDANSETRON HCL 4 MG PO TABS: 4.0000 mg | ORAL_TABLET | Freq: Three times a day (TID) | ORAL | Status: DC | PRN

## 2013-03-23 MED ORDER — ONDANSETRON HCL 4 MG PO TABS: 4.0000 mg | ORAL_TABLET | Freq: Four times a day (QID) | ORAL | Status: DC | PRN

## 2013-03-23 MED ORDER — OXYCODONE HCL 5 MG PO TABS
5.0000 mg | ORAL_TABLET | ORAL | Status: DC
  Administered 2013-03-23 – 2013-03-24 (×6): 5 mg via ORAL
  Filled 2013-03-23 (×4): qty 1

## 2013-03-23 MED ORDER — AMOXICILLIN-POT CLAVULANATE 875-125 MG PO TABS
1.0000 | ORAL_TABLET | Freq: Two times a day (BID) | ORAL | Status: DC
  Administered 2013-03-23: 1 via ORAL
  Filled 2013-03-23: qty 1

## 2013-03-23 NOTE — Progress Notes (Signed)
     Subjective: This lady does not like to take  pain medicines. She says it is painful to swallow, in particular to open her jaw which is understandable. She is able to swallow liquids.           Physical Exam: Blood pressure 125/74, pulse 65, temperature 98.4 F (36.9 C), temperature source Oral, resp. rate 16, height 5' (1.524 m), weight 102.059 kg (225 lb), last menstrual period 03/20/2013, SpO2 96.00%. She looks systemically well. Is not toxic or septic. Her right face is somewhat swollen and tender on palpation. Lung fields are clear. She is alert and orientated.   Investigations:     Basic Metabolic Panel:  Recent Labs  16/10/96 0505 03/23/13 0558  NA 137 138  K 3.5 3.8  CL 101 100  CO2 26 26  GLUCOSE 96 89  BUN 5* 4*  CREATININE 0.69 0.74  CALCIUM 8.9 9.1     Ct Maxillofacial W/cm  03/21/2013  *RADIOLOGY REPORT*  Clinical Data: Recent wisdom tooth extraction.  Facial cellulitis. On IV antibiotics.  CT MAXILLOFACIAL WITH CONTRAST  Technique:  Multidetector CT imaging of the maxillofacial structures was performed with intravenous contrast. Multiplanar CT image reconstructions were also generated.  Contrast: 75mL OMNIPAQUE IOHEXOL 300 MG/ML  SOLN  Comparison: 03/17/13  Findings: The patient has had recent extraction of all four wisdom teeth.  Fluid and air can be seen in the sockets of the mandible and maxilla.  There is improved air in the parasymphyseal region on the right adjacent to the mandible. No worrisome osseous findings. No paranasal sinus disease.  There is moderate cellulitis which appears slightly improved over the right face.  No submandibular or submental collection is seen.  Scattered reactive mild separate lymph nodes.  IMPRESSION: Overall slight improvement of right facial cellulitis. No visible abscess.   Original Report Authenticated By: Davonna Belling, M.D.       Medications: I have reviewed the patient's current medications.  Impression: 1.  Right facial cellulitis, status post tooth extraction. Improving slowly. 2. Obesity.     Plan: 1. Start scheduled oxycodone. 2. Continue with intravenous antibiotics. 3. Encourage oral intake as much as possible.     LOS: 6 days   Wilson Singer Pager [phone]  03/23/2013, 8:34 AM

## 2013-03-23 NOTE — Progress Notes (Signed)
UR Chart Review Completed  

## 2013-03-23 NOTE — Progress Notes (Signed)
Pt lost IV access this morning.  Dr. Karilyn Cota notified.  Stated that he will be switching her antibiotics to po.

## 2013-03-23 NOTE — Progress Notes (Addendum)
Spoke with pt regarding pain medicine regimen.  Stated that she did feel that it was helping.  Stated that she did feel like she was improving.  Stated she can open her mouth more and it is easier to talk now.  Stated she wanted to keep her current pain medicine regimen right now.  Did not feel she need additional pain medicine.

## 2013-03-24 LAB — BASIC METABOLIC PANEL
BUN: 5 mg/dL — ABNORMAL LOW (ref 6–23)
Chloride: 99 mEq/L (ref 96–112)
Creatinine, Ser: 0.78 mg/dL (ref 0.50–1.10)
GFR calc non Af Amer: >90 mL/min (ref >90)
Glucose, Bld: 96 mg/dL (ref 70–99)
Potassium: 3.6 mEq/L (ref 3.5–5.1)

## 2013-03-24 LAB — CBC
HCT: 35.3 % — ABNORMAL LOW (ref 36.0–46.0)
Hemoglobin: 11.9 g/dL — ABNORMAL LOW (ref 12.0–15.0)
MCHC: 33.7 g/dL (ref 30.0–36.0)
MCV: 79.5 fL (ref 78.0–100.0)
RDW: 15.2 % (ref 11.5–15.5)

## 2013-03-24 MED ORDER — OXYCODONE HCL 5 MG PO TABS: 5.0000 mg | ORAL_TABLET | ORAL | Status: AC | PRN

## 2013-03-24 MED ORDER — AMOXICILLIN-POT CLAVULANATE 875-125 MG PO TABS: 1.0000 | ORAL_TABLET | Freq: Two times a day (BID) | ORAL | Status: DC

## 2013-03-24 MED ORDER — LIDOCAINE VISCOUS 2 % MT SOLN: 20.0000 mL | Freq: Four times a day (QID) | OROMUCOSAL | Status: AC | PRN

## 2013-03-24 NOTE — Discharge Summary (Signed)
Physician Discharge Summary  Jessica Johns:[id number] DOB: [birth year] DOA: 03/17/2013  PCP: No primary provider on file.  Admit date: 03/17/2013 Discharge date: 03/24/2013  Time spent: Greater than 30 minutes  Recommendations for Outpatient Follow-up:  1. Followup with the health department in approximately one week. 2. Followup with dentist.   Discharge Diagnoses:  1. Right facial cellulitis, improving. 2. Status post tooth extraction. 3. Obesity.   Discharge Condition: Stable and improving.  Diet recommendation: As tolerated, soft and advanced as tolerated.  Filed Weights   03/17/13 1123  Weight: 102.059 kg (225 lb)    History of present illness:  This very pleasant 46 early presents to the hospital with symptoms of right facial pain and swelling. Please see initial history as outlined below: Jessica Johns is an 47 y.o. female who presents with worsening right face and neck swelling. She had 4 wisdom teeth extracted 2 days ago. The swelling became much worse today. She is unable to handle her own saliva, certainly difficulty swallowing pills. She's had no fevers or chills. No shortness of breath. CT of the face and neck show no abscess. The pain has become very severe, that she has had to alternate Vicodin and ibuprofen, though has been unable take anything today.  Hospital Course:  The patient was admitted and started on intravenous antibiotics. She has improved gradually over the last few days and her swelling is improved. Repeat CT scan of the area confirms improvement. She is now keen to go home and I agree with this. She is able to tolerate diet much more easily and this has been helped by taking scheduled opioids for the time being. She will need to followup with her primary care physician at the health department as well as her dentist. She will need another week's course of oral antibiotics.  Procedures:  None.  Consultations:  None.  Discharge Exam: Filed  Vitals:   03/23/13 1453 03/23/13 2118 03/24/13 0629 03/24/13 1020  BP: 147/67 134/56 142/60 114/83  Pulse: 83 88 76 98  Temp: 98 F (36.7 C) 98.6 F (37 C) 98 F (36.7 C) 98.1 F (36.7 C)  TempSrc:  Oral Oral Oral  Resp: 16 16 16 18   Height:      Weight:      SpO2: 99% 96% 99% 100%    General: She looks systemically well. She is not toxic or septic. Cardiovascular: Heart sounds are present and normal without murmurs sounds. Respiratory: Lung fields are clear. She is alert and orientated. Right facial swelling is decreased in the last few days significantly and she is able to open her jaw with less pain.  Discharge Instructions     Medication List    STOP taking these medications       HYDROcodone-acetaminophen 5-325 MG per tablet  Commonly known as:  NORCO/VICODIN      TAKE these medications       amoxicillin-clavulanate 875-125 MG per tablet  Commonly known as:  AUGMENTIN  Take 1 tablet by mouth every 12 (twelve) hours.     ibuprofen 200 MG tablet  Commonly known as:  ADVIL,MOTRIN  Take 800 mg by mouth every 8 (eight) hours as needed for pain. For pain     lidocaine 2 % solution  Commonly known as:  XYLOCAINE  Take 20 mLs by mouth every 6 (six) hours as needed.     oxyCODONE 5 MG immediate release tablet  Commonly known as:  Oxy IR/ROXICODONE  Take 1 tablet (5  mg total) by mouth every 3 (three) hours as needed.           Follow-up Information   Follow up On 03/24/2013.       The results of significant diagnostics from this hospitalization (including imaging, microbiology, ancillary and laboratory) are listed below for reference.    Significant Diagnostic Studies: Ct Soft Tissue Neck W Contrast  03/17/2013  *RADIOLOGY REPORT*  Clinical Data:  Right facial pain and swelling after tooth extraction  CT MAXILLOFACIAL WITH CONTRAST  Technique:  Multidetector CT imaging of the maxillofacial structure s was performed with intravenous contrast. Multiplanar CT  image reconstructions were als o generated.  A small metallic BB was placed on the right temple in order to reliably differentiate right from left.  Contrast: 75mL OMNIPAQUE IOHEXOL 300 MG/ML  SOLN  Comparison:   None.  Findings:  Sinuses, middle ears and mastoids are clear.  There is evidence of recent extraction of all four wisdom teeth.  There is no bony complications seen relative to that on either side.  There is no discernible facial swelling on the left.  On the right, there is a nonspecific swelling and edema of the soft tissues of the right face consistent with cellulitis.  No evidence of a drainable abscess at this moment.  Submandibular lymph nodes are slightly enlarged consistent with reactive change.  No suppuration.  No deep space involvement is identified.  IMPRESSION: Recent extraction of all four wisdom tooth.  Soft tissue swelling of the right side of the face consistent with cellulitis.  No evidence of drainable abscess.  CT NECK WITH CONTRAST  Technique:  Multidetector CT imaging of the neck was performed using the standard protocol following the bolus administration of intravenous contrast.  Contrast: 75mL OMNIPAQUE IOHEXOL 300 MG/ML  SOLN  Comparison:   None  Findings: Below the region of facial swelling on the right related to the recent tooth extraction and felt to be consistent with cellulitis, there is no worrisome finding in the neck.  Images in the lower neck are markedly degraded because of the patient's size. No upper mediastinal pathology is seen.  No evidence of suppurative lymph nodes.  No extension of inflammatory changes into the neck proper.  Thyroid gland appears unremarkable.  Vascular structures are unremarkable.  IMPRESSION: No extension of the right face inflammatory process into the neck proper.   Original Report Authenticated By: Paulina Fusi, M.D.    Ct Maxillofacial W/cm  03/21/2013  *RADIOLOGY REPORT*  Clinical Data: Recent wisdom tooth extraction.  Facial cellulitis.  On IV antibiotics.  CT MAXILLOFACIAL WITH CONTRAST  Technique:  Multidetector CT imaging of the maxillofacial structures was performed with intravenous contrast. Multiplanar CT image reconstructions were also generated.  Contrast: 75mL OMNIPAQUE IOHEXOL 300 MG/ML  SOLN  Comparison: 03/17/13  Findings: The patient has had recent extraction of all four wisdom teeth.  Fluid and air can be seen in the sockets of the mandible and maxilla.  There is improved air in the parasymphyseal region on the right adjacent to the mandible. No worrisome osseous findings. No paranasal sinus disease.  There is moderate cellulitis which appears slightly improved over the right face.  No submandibular or submental collection is seen.  Scattered reactive mild separate lymph nodes.  IMPRESSION: Overall slight improvement of right facial cellulitis. No visible abscess.   Original Report Authenticated By: Davonna Belling, M.D.    Ct Maxillofacial W/cm  03/17/2013  *RADIOLOGY REPORT*  Clinical Data:  Right facial pain and swelling  after tooth extraction  CT MAXILLOFACIAL WITH CONTRAST  Technique:  Multidetector CT imaging of the maxillofacial structure s was performed with intravenous contrast. Multiplanar CT image reconstructions were als o generated.  A small metallic BB was placed on the right temple in order to reliably differentiate right from left.  Contrast: 75mL OMNIPAQUE IOHEXOL 300 MG/ML  SOLN  Comparison:   None.  Findings:  Sinuses, middle ears and mastoids are clear.  There is evidence of recent extraction of all four wisdom teeth.  There is no bony complications seen relative to that on either side.  There is no discernible facial swelling on the left.  On the right, there is a nonspecific swelling and edema of the soft tissues of the right face consistent with cellulitis.  No evidence of a drainable abscess at this moment.  Submandibular lymph nodes are slightly enlarged consistent with reactive change.  No suppuration.  No deep  space involvement is identified.  IMPRESSION: Recent extraction of all four wisdom tooth.  Soft tissue swelling of the right side of the face consistent with cellulitis.  No evidence of drainable abscess.  CT NECK WITH CONTRAST  Technique:  Multidetector CT imaging of the neck was performed using the standard protocol following the bolus administration of intravenous contrast.  Contrast: 75mL OMNIPAQUE IOHEXOL 300 MG/ML  SOLN  Comparison:   None  Findings: Below the region of facial swelling on the right related to the recent tooth extraction and felt to be consistent with cellulitis, there is no worrisome finding in the neck.  Images in the lower neck are markedly degraded because of the patient's size. No upper mediastinal pathology is seen.  No evidence of suppurative lymph nodes.  No extension of inflammatory changes into the neck proper.  Thyroid gland appears unremarkable.  Vascular structures are unremarkable.  IMPRESSION: No extension of the right face inflammatory process into the neck proper.   Original Report Authenticated By: Paulina Fusi, M.D.         Labs: Basic Metabolic Panel:  Recent Labs Lab 03/17/13 1202 03/19/13 0617 03/21/13 0505 03/23/13 0558 03/24/13 0522  NA 140 133* 137 138 138  K 3.4* 4.0 3.5 3.8 3.6  CL 108 100 101 100 99  CO2  --  25 26 26 28   GLUCOSE 96 94 96 89 96  BUN 9 5* 5* 4* 5*  CREATININE 0.80 0.64 0.69 0.74 0.78  CALCIUM  --  8.4 8.9 9.1 9.3   Liver Function Tests:  Recent Labs Lab 03/23/13 0558  AST 31  ALT 19  ALKPHOS 42  BILITOT 0.3  PROT 6.6  ALBUMIN 3.1*     CBC:  Recent Labs Lab 03/17/13 1151 03/17/13 1202 03/19/13 0617 03/23/13 0558 03/24/13 0522  WBC 11.2*  --  6.5 5.9 7.6  NEUTROABS 6.8  --  3.0  --   --   HGB 10.7* 11.9* 10.7* 11.5* 11.9*  HCT 31.6* 35.0* 32.6* 34.0* 35.3*  MCV 79.6  --  81.9 80.4 79.5  PLT 192  --  208 222 216         Signed:  Lorenna Lurry C  [facility] 03/24/2013, 10:34  AM

## 2015-01-28 IMAGING — CT CT MAXILLOFACIAL W/ CM
3 series · 16 of 47 positions shown, 19 images · IV contrast (Omnipaque 300)
Comparison: 03/17/13

CLINICAL DATA: Recent wisdom tooth extraction.  Facial cellulitis.
On IV antibiotics.

CT MAXILLOFACIAL WITH CONTRAST
TECHNIQUE: Multidetector CT imaging of the maxillofacial
structures was performed with intravenous contrast. Multiplanar CT
image reconstructions were also generated.
Contrast: 75mL OMNIPAQUE IOHEXOL 300 MG/ML  SOLN

[Series 2: max st axial · axial · 0.29mm/px · z∈[+30,+164]mm · 10 of 79 slices shown, 13 images]
[im 6/79  brain]
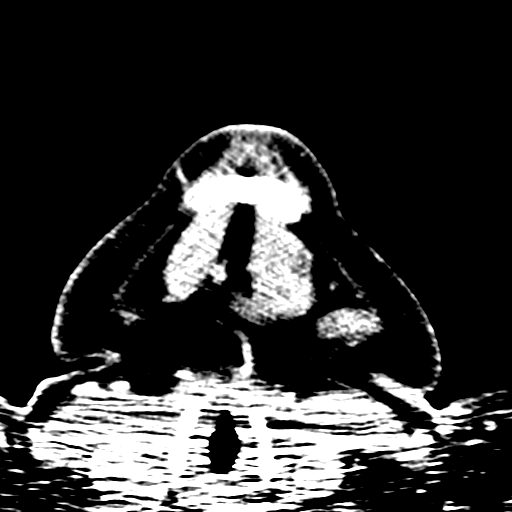
[im 6/79  bone]
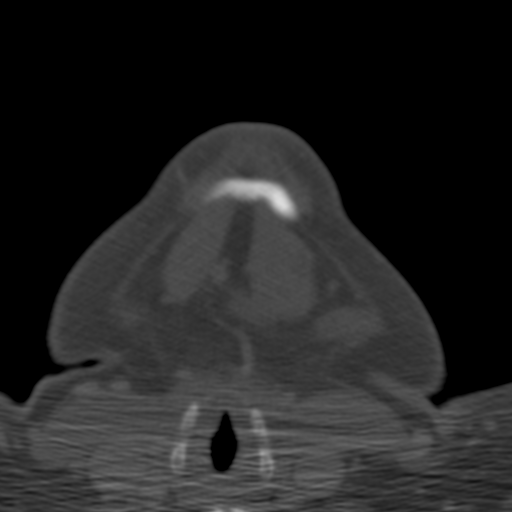
[im 14/79  bone]
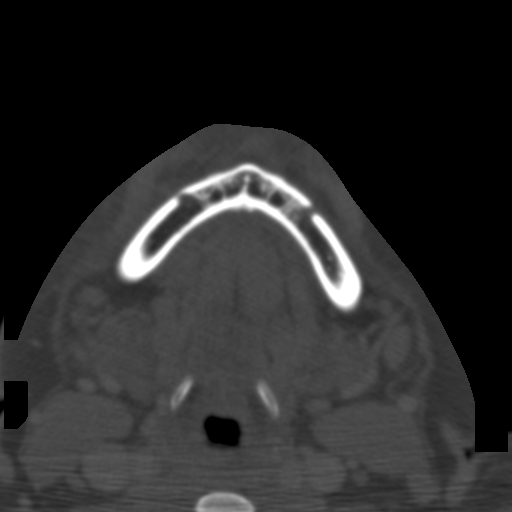
[im 22/79  bone]
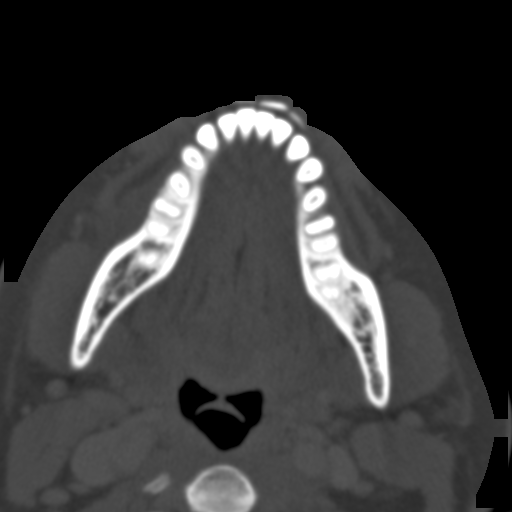
[im 27/79  bone]
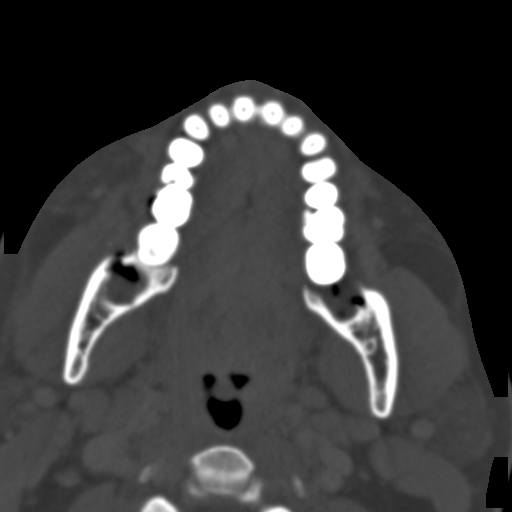
[im 35/79  brain]
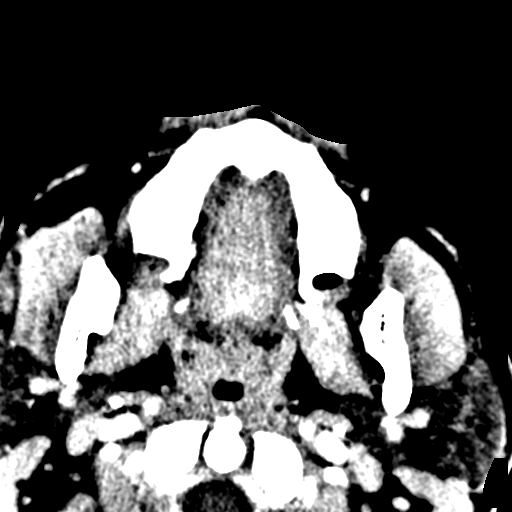
[im 35/79  bone]
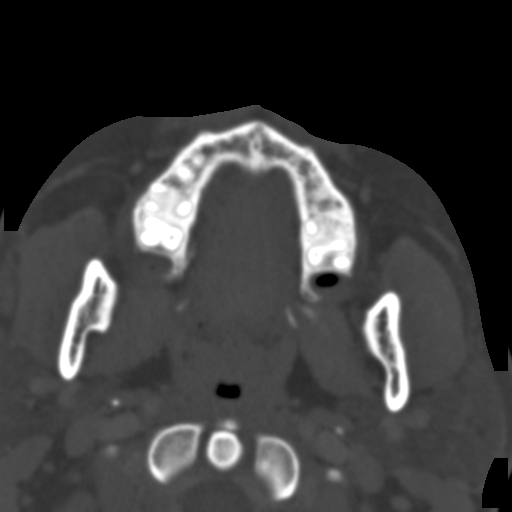
[im 44/79  bone]
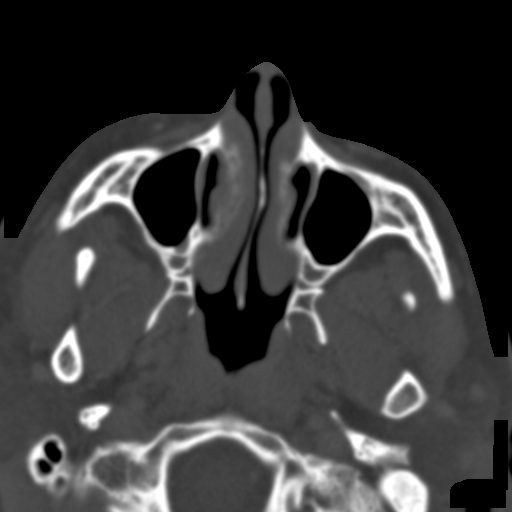
[im 52/79  bone]
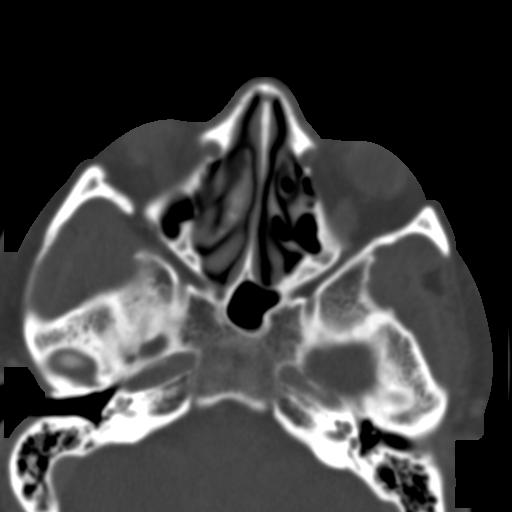
[im 60/79  bone]
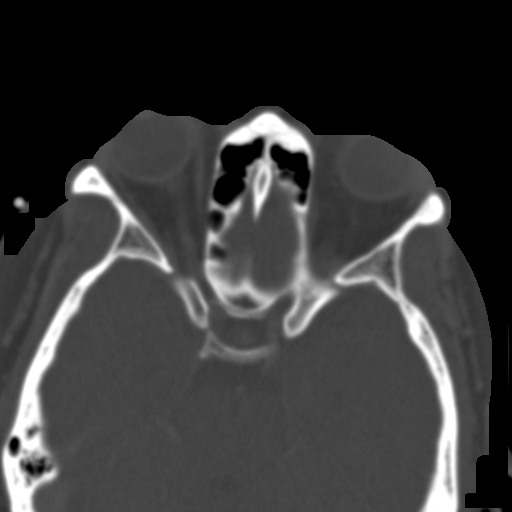
[im 65/79  brain]
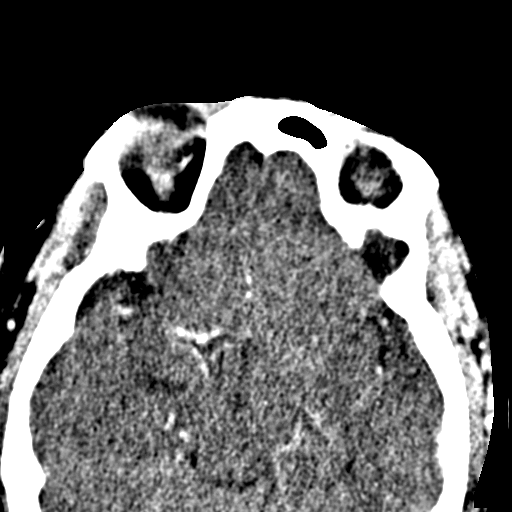
[im 65/79  bone]
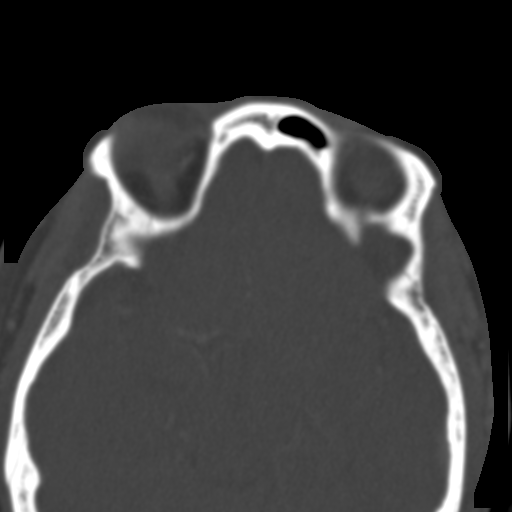
[im 73/79  bone]
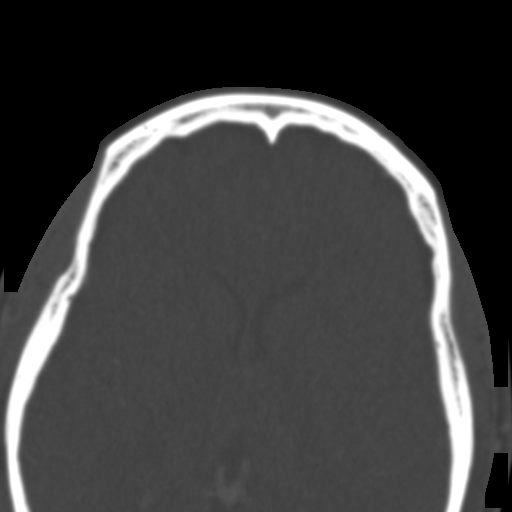

[Series 4: max st coro · coronal · 0.30mm/px · 3 of 66 slices shown]
[im 22/66  bone]
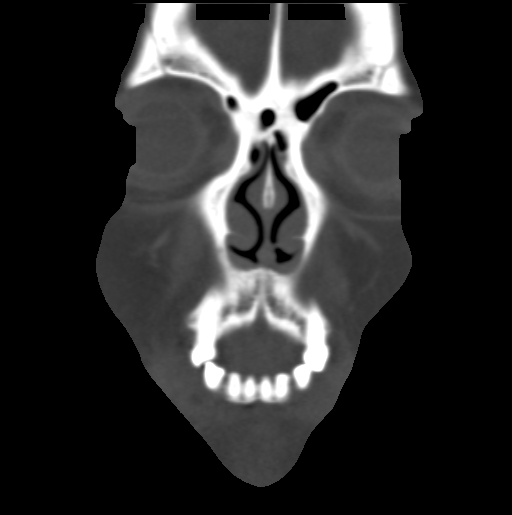
[im 29/66  bone]
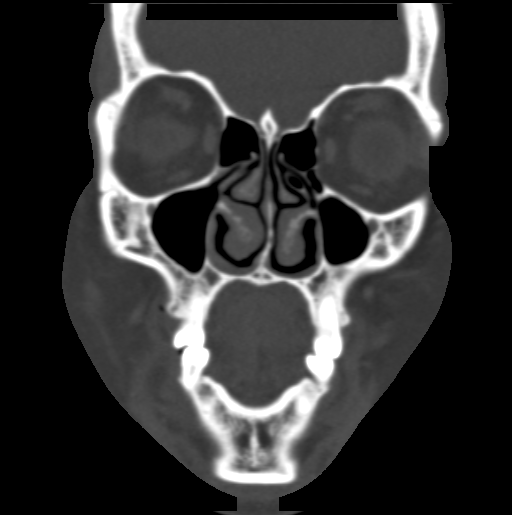
[im 37/66  bone]
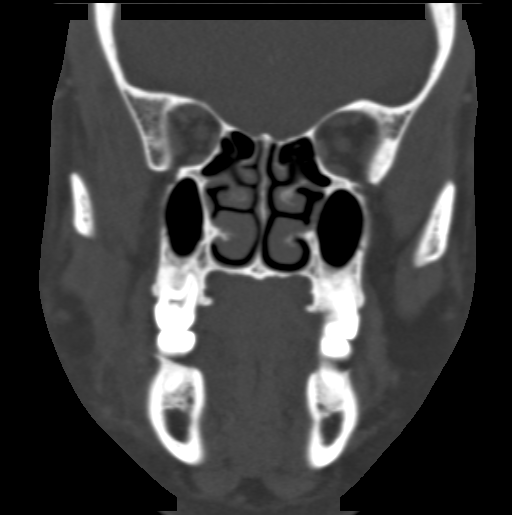

[Series 5: max st sag · sagittal · 0.27mm/px · 3 of 83 slices shown]
[im 28/83  bone]
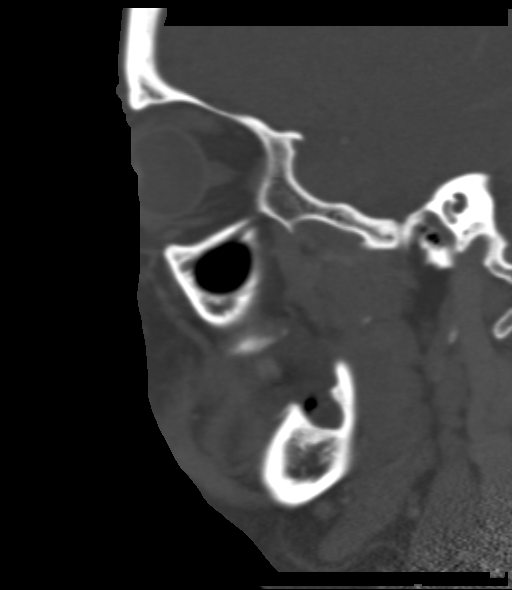
[im 42/83  bone]
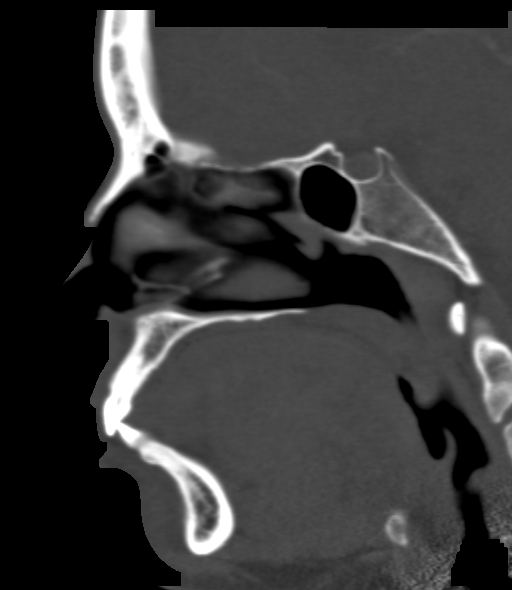
[im 55/83  bone]
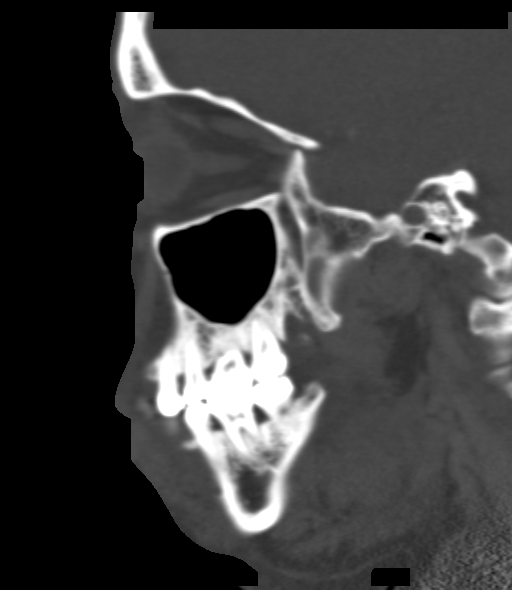

[16 of 47 positions shown; findings below may reference images not displayed]

FINDINGS: The patient has had recent extraction of all four wisdom
teeth.  Fluid and air can be seen in the sockets of the mandible
and maxilla.  There is improved air in the parasymphyseal region on
the right adjacent to the mandible. No worrisome osseous findings.
No paranasal sinus disease.  There is moderate cellulitis which
appears slightly improved over the right face.  No submandibular or
submental collection is seen.  Scattered reactive mild separate
lymph nodes.
IMPRESSION: Overall slight improvement of right facial cellulitis. No visible
abscess.

## 2015-04-27 ENCOUNTER — Encounter (HOSPITAL_COMMUNITY): Payer: Self-pay | Admitting: *Deleted

## 2015-04-27 ENCOUNTER — Emergency Department (HOSPITAL_COMMUNITY)
Admission: EM | Admit: 2015-04-27 | Discharge: 2015-04-27 | Disposition: A | Discharge status: Home / Self Care | Payer: Self-pay | Attending: Emergency Medicine | Admitting: Emergency Medicine

## 2015-04-27 DIAGNOSIS — R21 Rash and other nonspecific skin eruption: Secondary | ICD-10-CM | POA: Insufficient documentation

## 2015-04-27 DIAGNOSIS — R109 Unspecified abdominal pain: Secondary | ICD-10-CM | POA: Insufficient documentation

## 2015-04-27 DIAGNOSIS — R112 Nausea with vomiting, unspecified: Secondary | ICD-10-CM | POA: Insufficient documentation

## 2015-04-27 DIAGNOSIS — R197 Diarrhea, unspecified: Secondary | ICD-10-CM | POA: Insufficient documentation

## 2015-04-27 DIAGNOSIS — Z7982 Long term (current) use of aspirin: Secondary | ICD-10-CM | POA: Insufficient documentation

## 2015-04-27 LAB — COMPREHENSIVE METABOLIC PANEL
ALT: 15 U/L (ref 14–54)
AST: 19 U/L (ref 15–41)
Albumin: 3.9 g/dL (ref 3.5–5.0)
Alkaline Phosphatase: 43 U/L (ref 38–126)
Anion gap: 9 (ref 5–15)
BUN: 10 mg/dL (ref 6–20)
CHLORIDE: 106 mmol/L (ref 101–111)
CO2: 21 mmol/L — AB (ref 22–32)
Calcium: 8.9 mg/dL (ref 8.9–10.3)
Creatinine, Ser: 0.73 mg/dL (ref 0.44–1.00)
GFR calc Af Amer: >60 mL/min (ref >60)
GLUCOSE: 107 mg/dL — AB (ref 65–99)
POTASSIUM: 3.8 mmol/L (ref 3.5–5.1)
SODIUM: 136 mmol/L (ref 135–145)
Total Bilirubin: 0.5 mg/dL (ref 0.3–1.2)
Total Protein: 7.7 g/dL (ref 6.5–8.1)

## 2015-04-27 LAB — CBC WITH DIFFERENTIAL/PLATELET
BASOS ABS: 0 10*3/uL (ref 0.0–0.1)
BASOS PCT: 0 % (ref 0–1)
EOS ABS: 0.2 10*3/uL (ref 0.0–0.7)
Eosinophils Relative: 2 % (ref 0–5)
HCT: 38 % (ref 36.0–46.0)
HEMOGLOBIN: 12.6 g/dL (ref 12.0–15.0)
LYMPHS PCT: 38 % (ref 12–46)
Lymphs Abs: 3.3 10*3/uL (ref 0.7–4.0)
MCH: 26.5 pg (ref 26.0–34.0)
MCHC: 33.2 g/dL (ref 30.0–36.0)
MCV: 80 fL (ref 78.0–100.0)
MONOS PCT: 4 % (ref 3–12)
Monocytes Absolute: 0.3 10*3/uL (ref 0.1–1.0)
Neutro Abs: 4.8 10*3/uL (ref 1.7–7.7)
Neutrophils Relative %: 56 % (ref 43–77)
PLATELETS: 200 10*3/uL (ref 150–400)
RBC: 4.75 MIL/uL (ref 3.87–5.11)
RDW: 15.9 % — AB (ref 11.5–15.5)
WBC: 8.6 10*3/uL (ref 4.0–10.5)

## 2015-04-27 MED ORDER — PREDNISONE 10 MG PO TABS: 20.0000 mg | ORAL_TABLET | Freq: Every day | ORAL | Status: AC

## 2015-04-27 MED ORDER — ONDANSETRON HCL 4 MG/2ML IJ SOLN
4.0000 mg | Freq: Once | INTRAMUSCULAR | Status: AC
  Administered 2015-04-27: 4 mg via INTRAVENOUS
  Filled 2015-04-27: qty 2

## 2015-04-27 MED ORDER — PROMETHAZINE HCL 25 MG PO TABS: 25.0000 mg | ORAL_TABLET | Freq: Four times a day (QID) | ORAL | Status: AC | PRN

## 2015-04-27 MED ORDER — SODIUM CHLORIDE 0.9 % IV BOLUS (SEPSIS)
1000.0000 mL | Freq: Once | INTRAVENOUS | Status: AC
Start: 2015-04-27 — End: 2015-04-27
  Administered 2015-04-27: 1000 mL via INTRAVENOUS

## 2015-04-27 NOTE — ED Provider Notes (Signed)
CSN: [id number]     Arrival date & time 04/27/15  1003 History  This chart was scribed for Bethann BerkshireJoseph Jenaya Saar, MD by Elon SpannerGarrett Cook, ED Scribe. This patient was seen in room APA18/APA18 and the patient's care was started at 10:32 AM.    Chief Complaint  Patient presents with  . Emesis   Patient is a 49 y.o. female presenting with vomiting. The history is provided by the patient (pt complains of vomiting). No language interpreter was used.  Emesis Severity:  Moderate Timing:  Intermittent Associated symptoms: abdominal pain and diarrhea   Associated symptoms: no headaches    HPI Comments: Romilda JoyGladys K Lahue is a 49 y.o. female who presents to the Emergency Department complaining of emesis onset two days ago with associated nausea, abdominal pain, and yellow diarrhea.  She denies fever.    She also notes a rash onset 1 month ago after she was bitten by mosquitos on her right arm.  The  mosquito bite marks resolved, and she developed the current rash on her upper and lower extremities around that time.  SHe has taken benadryl and ASA without relief.    History reviewed. No pertinent past medical history. Past Surgical History  Procedure Laterality Date  . Tubal ligation    . Wrist surgery     No family history on file. History  Substance Use Topics  . Smoking status: Never Smoker   . Smokeless tobacco: Not on file  . Alcohol Use: No   OB History    No data available     Review of Systems  Constitutional: Negative for appetite change and fatigue.  HENT: Negative for congestion, ear discharge and sinus pressure.   Eyes: Negative for discharge.  Respiratory: Negative for cough.   Cardiovascular: Negative for chest pain.  Gastrointestinal: Positive for nausea, vomiting, abdominal pain and diarrhea.  Genitourinary: Negative for frequency and hematuria.  Musculoskeletal: Negative for back pain.  Skin: Positive for rash.  Neurological: Negative for seizures and headaches.   Psychiatric/Behavioral: Negative for hallucinations.      Allergies  Review of patient's allergies indicates no known allergies.  Home Medications   Prior to Admission medications   Medication Sig Start Date End Date Taking? Authorizing Provider  acetaminophen (TYLENOL) 500 MG tablet Take 1,500 mg by mouth 3 (three) times daily as needed.   Yes Historical Provider, MD  aspirin EC 325 MG tablet Take 650 mg by mouth 3 (three) times daily as needed for mild pain.   Yes Historical Provider, MD  amoxicillin-clavulanate (AUGMENTIN) 875-125 MG per tablet Take 1 tablet by mouth every 12 (twelve) hours. Patient not taking: Reported on 04/27/2015 03/24/13   Wilson SingerNimish C Gosrani, MD  lidocaine (XYLOCAINE) 2 % solution Take 20 mLs by mouth every 6 (six) hours as needed. Patient not taking: Reported on 04/27/2015 03/24/13   Wilson SingerNimish C Gosrani, MD  oxyCODONE (OXY IR/ROXICODONE) 5 MG immediate release tablet Take 1 tablet (5 mg total) by mouth every 3 (three) hours as needed. Patient not taking: Reported on 04/27/2015 03/24/13   Nimish C Karilyn CotaGosrani, MD   BP 150/67 mmHg  Pulse 78  Temp(Src) 98 F (36.7 C) (Oral)  Resp 16  Ht 4' 11.5" (1.511 m)  Wt 225 lb (102.059 kg)  BMI 44.70 kg/m2  SpO2 100%  LMP 04/11/2015 Physical Exam  Constitutional: She is oriented to person, place, and time. She appears well-developed.  HENT:  Head: Normocephalic.  Eyes: Conjunctivae and EOM are normal. No scleral icterus.  Neck: Neck  supple. No thyromegaly present.  Cardiovascular: Normal rate and regular rhythm.  Exam reveals no gallop and no friction rub.   No murmur heard. Pulmonary/Chest: No stridor. She has no wheezes. She has no rales. She exhibits no tenderness.  Abdominal: She exhibits no distension. There is no tenderness. There is no rebound.  Musculoskeletal: Normal range of motion. She exhibits no edema.  Lymphadenopathy:    She has no cervical adenopathy.  Neurological: She is oriented to person, place, and  time. She exhibits normal muscle tone. Coordination normal.  Skin: No erythema.  Several 1 cm diameter macular rash on her upper and lower extremities.    Psychiatric: She has a normal mood and affect. Her behavior is normal.  Nursing note and vitals reviewed.   ED Course  Procedures (including critical care time)  DIAGNOSTIC STUDIES: Oxygen Saturation is 100% on RA, normal by my interpretation.    COORDINATION OF CARE:  10:40 AM Discussed treatment plan with patient at bedside.  Patient acknowledges and agrees with plan.    Labs Review Labs Reviewed - No data to display  Imaging Review No results found.   EKG Interpretation None      MDM   Final diagnoses:  None   Rash with vomiting.  Rash possible allergic,  tx with prednisone and follow up  The chart was scribed for me under my direct supervision.  I personally performed the history, physical, and medical decision making and all procedures in the evaluation of this patient.Bethann Berkshire, MD 04/27/15 1452

## 2015-04-27 NOTE — ED Notes (Signed)
Pt states she was bitten by mosquitos a month ago. States once one site leaves, several other areas "pop up" Pt states headache began Tuesday. Vomiting began 2 days ago along with loose stools. Pt also states generalized abdominal pain.

## 2015-04-27 NOTE — Discharge Instructions (Signed)
Follow up next week for recheck °

## 2016-08-11 ENCOUNTER — Other Ambulatory Visit (HOSPITAL_COMMUNITY): Payer: Self-pay | Admitting: *Deleted

## 2016-08-11 DIAGNOSIS — Z1231 Encounter for screening mammogram for malignant neoplasm of breast: Secondary | ICD-10-CM

## 2016-09-10 ENCOUNTER — Ambulatory Visit (HOSPITAL_COMMUNITY): Payer: Self-pay

## 2016-10-12 ENCOUNTER — Ambulatory Visit (HOSPITAL_COMMUNITY): Payer: Self-pay

## 2017-08-06 ENCOUNTER — Other Ambulatory Visit (HOSPITAL_COMMUNITY): Payer: Self-pay | Admitting: *Deleted

## 2017-08-06 DIAGNOSIS — Z1231 Encounter for screening mammogram for malignant neoplasm of breast: Secondary | ICD-10-CM

## 2017-08-13 ENCOUNTER — Ambulatory Visit (HOSPITAL_COMMUNITY)
Admission: RE | Admit: 2017-08-13 | Discharge: 2017-08-13 | Disposition: A | Discharge status: Home / Self Care | Payer: PRIVATE HEALTH INSURANCE | Source: Ambulatory Visit | Attending: *Deleted | Admitting: *Deleted

## 2017-08-13 ENCOUNTER — Encounter (HOSPITAL_COMMUNITY): Payer: Self-pay

## 2017-08-13 DIAGNOSIS — Z1231 Encounter for screening mammogram for malignant neoplasm of breast: Secondary | ICD-10-CM | POA: Diagnosis present

## 2019-08-02 ENCOUNTER — Ambulatory Visit: Payer: Self-pay

## 2019-08-10 ENCOUNTER — Other Ambulatory Visit: Payer: Self-pay

## 2019-08-10 ENCOUNTER — Ambulatory Visit: Payer: Self-pay | Admitting: Family Medicine

## 2019-08-10 DIAGNOSIS — Z9851 Tubal ligation status: Secondary | ICD-10-CM

## 2019-08-10 DIAGNOSIS — F1911 Other psychoactive substance abuse, in remission: Secondary | ICD-10-CM

## 2019-08-10 DIAGNOSIS — Z113 Encounter for screening for infections with a predominantly sexual mode of transmission: Secondary | ICD-10-CM

## 2019-08-10 DIAGNOSIS — N898 Other specified noninflammatory disorders of vagina: Secondary | ICD-10-CM

## 2019-08-10 DIAGNOSIS — N730 Acute parametritis and pelvic cellulitis: Secondary | ICD-10-CM

## 2019-08-10 LAB — WET PREP FOR TRICH, YEAST, CLUE
Trichomonas Exam: NEGATIVE
Yeast Exam: NEGATIVE

## 2019-08-10 MED ORDER — METRONIDAZOLE 500 MG PO TABS: 500.0000 mg | ORAL_TABLET | Freq: Two times a day (BID) | ORAL | 0 refills | Status: AC

## 2019-08-10 MED ORDER — DOXYCYCLINE HYCLATE 100 MG PO CAPS: 100.0000 mg | ORAL_CAPSULE | Freq: Two times a day (BID) | ORAL | 0 refills | Status: DC

## 2019-08-10 MED ORDER — METRONIDAZOLE 500 MG PO TABS: 500.0000 mg | ORAL_TABLET | Freq: Two times a day (BID) | ORAL | 0 refills | Status: DC

## 2019-08-10 MED ORDER — CEFTRIAXONE SODIUM 250 MG IJ SOLR
250.0000 mg | Freq: Once | INTRAMUSCULAR | Status: AC
  Administered 2019-08-10: 250 mg via INTRAMUSCULAR

## 2019-08-10 MED ORDER — DOXYCYCLINE HYCLATE 100 MG PO CAPS: 100.0000 mg | ORAL_CAPSULE | Freq: Two times a day (BID) | ORAL | 0 refills | Status: AC

## 2019-08-10 NOTE — Progress Notes (Signed)
Wet mount reviewed by provider; per verbal order, added #28 Metronidazole 500 mg, 1 tab po bid for 14 days to treatment regimen. Provider orders completed.

## 2019-08-10 NOTE — Progress Notes (Signed)
STI clinic/screening visit  Subjective:  Jessica Johns is a 53 y.o. female being seen today for an STI screening visit. The patient reports they do have symptoms.  Patient has the following medical conditions:   Patient Active Problem List   Diagnosis Date Noted  . H/O tubal ligation 08/10/2019  . Obesity, unspecified 03/21/2013  . S/P tooth extraction 03/17/2013     Chief Complaint  Patient presents with  . SEXUALLY TRANSMITTED DISEASE    STD checks    HPI  Patient reports abdominal pain and discharge- present for 1-2 weeks. Reports associated pain with sex. Denies fever, chills, nausea and vomiting.   See flowsheet for further details and programmatic requirements.    The following portions of the patient's history were reviewed and updated as appropriate: allergies, current medications, past medical history, past social history, past surgical history and problem list.  Objective:  There were no vitals filed for this visit.  Physical Exam Vitals signs and nursing note reviewed.  Constitutional:      Appearance: Normal appearance.  HENT:     Head: Normocephalic and atraumatic.     Mouth/Throat:     Mouth: Mucous membranes are moist.     Pharynx: Oropharynx is clear. No oropharyngeal exudate or posterior oropharyngeal erythema.  Pulmonary:     Effort: Pulmonary effort is normal.  Abdominal:     General: Abdomen is flat.     Palpations: Abdomen is soft. There is no mass.     Tenderness: There is abdominal tenderness in the suprapubic area. There is guarding (mild guarding). There is no rebound.  Genitourinary:    General: Normal vulva.     Exam position: Lithotomy position.     Pubic Area: No rash or pubic lice.      Labia:        Right: No rash or lesion.        Left: No rash or lesion.      Vagina: Normal. No vaginal discharge, erythema, bleeding or lesions.     Cervix: Cervical motion tenderness and discharge (not from cervical os, minimal white dicharge  on exam. ) present. No lesion or erythema.     Uterus: Normal. Tender.      Adnexa: Right adnexa normal and left adnexa normal.     Rectum: Normal.  Lymphadenopathy:     Head:     Right side of head: No preauricular or posterior auricular adenopathy.     Left side of head: No preauricular or posterior auricular adenopathy.     Cervical: No cervical adenopathy.     Upper Body:     Right upper body: No supraclavicular or axillary adenopathy.     Left upper body: No supraclavicular or axillary adenopathy.     Lower Body: No right inguinal adenopathy. No left inguinal adenopathy.  Skin:    General: Skin is warm and dry.     Findings: No rash.  Neurological:     Mental Status: She is alert and oriented to person, place, and time.       Assessment and Plan:  [name] is a 53 y.o. female presenting to the [facility] Department for STI screening  1. Screening examination for venereal disease - WET PREP FOR [place], YEAST, CLUE - Chlamydia/Gonorrhea [place] Lab - Syphilis Serology,  Lab - Gonococcus culture - HIV/HCV/HBV- state labs  2. H/O tubal ligation Ref to [facility]  3. Vaginal discharge None on exam  4. Vaginal odor Not noted on exam  5. PID (acute pelvic inflammatory disease) Treat PID per standing order Return for recheck in 1 week Return if sx worsen prior to next appt - cefTRIAXone (ROCEPHIN) injection 250 mg - doxycycline (VIBRAMYCIN) 100 MG capsule; Take 1 capsule (100 mg total) by mouth 2 (two) times daily.  Dispense: 14 capsule; Refill: 0     Return if symptoms worsen or fail to improve.  No future appointments.  Federico FlakeKimberly Niles Jovann Luse, MD

## 2019-08-14 LAB — GONOCOCCUS CULTURE

## 2019-08-16 ENCOUNTER — Encounter: Payer: Self-pay | Admitting: Family Medicine

## 2019-08-23 NOTE — Addendum Note (Signed)
Addended by: Caryl Bis on: 08/23/2019 02:27 PM   Modules accepted: Orders

## 2019-12-22 ENCOUNTER — Other Ambulatory Visit: Payer: Self-pay

## 2019-12-22 ENCOUNTER — Emergency Department
Admission: EM | Admit: 2019-12-22 | Discharge: 2019-12-22 | Disposition: A | Discharge status: Home / Self Care | Payer: PRIVATE HEALTH INSURANCE | Attending: Emergency Medicine | Admitting: Emergency Medicine

## 2019-12-22 ENCOUNTER — Telehealth: Payer: Self-pay | Admitting: Emergency Medicine

## 2019-12-22 ENCOUNTER — Emergency Department: Payer: PRIVATE HEALTH INSURANCE

## 2019-12-22 DIAGNOSIS — R0789 Other chest pain: Secondary | ICD-10-CM | POA: Insufficient documentation

## 2019-12-22 DIAGNOSIS — Z5321 Procedure and treatment not carried out due to patient leaving prior to being seen by health care provider: Secondary | ICD-10-CM | POA: Insufficient documentation

## 2019-12-22 LAB — COMPREHENSIVE METABOLIC PANEL
ALT: 35 U/L (ref 0–44)
AST: 66 U/L — ABNORMAL HIGH (ref 15–41)
Albumin: 3.7 g/dL (ref 3.5–5.0)
Alkaline Phosphatase: 65 U/L (ref 38–126)
Anion gap: 8 (ref 5–15)
BUN: 12 mg/dL (ref 6–20)
CO2: 28 mmol/L (ref 22–32)
Calcium: 9.3 mg/dL (ref 8.9–10.3)
Chloride: 105 mmol/L (ref 98–111)
Creatinine, Ser: 0.83 mg/dL (ref 0.44–1.00)
GFR calc Af Amer: >60 mL/min (ref >60)
GFR calc non Af Amer: >60 mL/min (ref >60)
Glucose, Bld: 135 mg/dL — ABNORMAL HIGH (ref 70–99)
Potassium: 3.2 mmol/L — ABNORMAL LOW (ref 3.5–5.1)
Sodium: 141 mmol/L (ref 135–145)
Total Bilirubin: 0.6 mg/dL (ref 0.3–1.2)
Total Protein: 7.1 g/dL (ref 6.5–8.1)

## 2019-12-22 LAB — CBC
HCT: 37.4 % (ref 36.0–46.0)
Hemoglobin: 11.9 g/dL — ABNORMAL LOW (ref 12.0–15.0)
MCH: 25.1 pg — ABNORMAL LOW (ref 26.0–34.0)
MCHC: 31.8 g/dL (ref 30.0–36.0)
MCV: 78.7 fL — ABNORMAL LOW (ref 80.0–100.0)
Platelets: 217 10*3/uL (ref 150–400)
RBC: 4.75 MIL/uL (ref 3.87–5.11)
RDW: 18 % — ABNORMAL HIGH (ref 11.5–15.5)
WBC: 11.6 10*3/uL — ABNORMAL HIGH (ref 4.0–10.5)
nRBC: 0 % (ref 0.0–0.2)

## 2019-12-22 LAB — TROPONIN I (HIGH SENSITIVITY)
Troponin I (High Sensitivity): 5 ng/L (ref <18)
Troponin I (High Sensitivity): 3 ng/L (ref <18)

## 2019-12-22 NOTE — ED Notes (Signed)
Per registation. Pt left AMA at [phone]

## 2019-12-22 NOTE — ED Triage Notes (Signed)
Pt brought in by EMS states chest pain woke her up tonight. Denies any cardiac hx or hx of chest pain. EMS gave asa 324 mg po, all VS were wnl.

## 2019-12-22 NOTE — Telephone Encounter (Signed)
Called patient due to lwot to inquire about condition and follow up plans. She says she has not called her doctor yet.   I told her to let her doctor know her symptoms and that lab resutls are available.  I told her that it could be her heart.   I told her she can always return here if she decides to .

## 2022-07-07 ENCOUNTER — Other Ambulatory Visit: Payer: Self-pay | Admitting: Obstetrics and Gynecology

## 2022-07-07 ENCOUNTER — Other Ambulatory Visit: Payer: Self-pay | Admitting: *Deleted

## 2022-07-07 DIAGNOSIS — Z1231 Encounter for screening mammogram for malignant neoplasm of breast: Secondary | ICD-10-CM

## 2022-07-15 ENCOUNTER — Emergency Department: Payer: Self-pay

## 2022-07-15 ENCOUNTER — Encounter
Admission: EM | Disposition: A | Discharge status: Home / Self Care | Payer: Self-pay | Source: Home / Self Care | Attending: General Surgery

## 2022-07-15 ENCOUNTER — Observation Stay: Payer: Self-pay | Admitting: Anesthesiology

## 2022-07-15 ENCOUNTER — Inpatient Hospital Stay
Admission: EM | Admit: 2022-07-15 | Discharge: 2022-07-19 | DRG: 418 | Disposition: A | Discharge status: Home / Self Care | Payer: Self-pay | Attending: General Surgery | Admitting: General Surgery

## 2022-07-15 ENCOUNTER — Other Ambulatory Visit: Payer: Self-pay

## 2022-07-15 DIAGNOSIS — Z79899 Other long term (current) drug therapy: Secondary | ICD-10-CM

## 2022-07-15 DIAGNOSIS — R079 Chest pain, unspecified: Principal | ICD-10-CM

## 2022-07-15 DIAGNOSIS — Z6841 Body Mass Index (BMI) 40.0 and over, adult: Secondary | ICD-10-CM

## 2022-07-15 DIAGNOSIS — K810 Acute cholecystitis: Principal | ICD-10-CM | POA: Diagnosis present

## 2022-07-15 DIAGNOSIS — G8918 Other acute postprocedural pain: Secondary | ICD-10-CM | POA: Diagnosis not present

## 2022-07-15 DIAGNOSIS — R1010 Upper abdominal pain, unspecified: Secondary | ICD-10-CM

## 2022-07-15 DIAGNOSIS — K219 Gastro-esophageal reflux disease without esophagitis: Secondary | ICD-10-CM | POA: Diagnosis present

## 2022-07-15 HISTORY — DX: Gastro-esophageal reflux disease without esophagitis: K21.9

## 2022-07-15 LAB — COMPREHENSIVE METABOLIC PANEL
ALT: 160 U/L — ABNORMAL HIGH (ref 0–44)
AST: 319 U/L — ABNORMAL HIGH (ref 15–41)
Albumin: 3.9 g/dL (ref 3.5–5.0)
Alkaline Phosphatase: 74 U/L (ref 38–126)
Anion gap: 8 (ref 5–15)
BUN: 16 mg/dL (ref 6–20)
CO2: 25 mmol/L (ref 22–32)
Calcium: 9.3 mg/dL (ref 8.9–10.3)
Chloride: 107 mmol/L (ref 98–111)
Creatinine, Ser: 0.99 mg/dL (ref 0.44–1.00)
GFR, Estimated: >60 mL/min (ref >60)
Glucose, Bld: 159 mg/dL — ABNORMAL HIGH (ref 70–99)
Potassium: 4 mmol/L (ref 3.5–5.1)
Sodium: 140 mmol/L (ref 135–145)
Total Bilirubin: 0.9 mg/dL (ref 0.3–1.2)
Total Protein: 7.9 g/dL (ref 6.5–8.1)

## 2022-07-15 LAB — CBC
HCT: 40.8 % (ref 36.0–46.0)
Hemoglobin: 13.2 g/dL (ref 12.0–15.0)
MCH: 26.8 pg (ref 26.0–34.0)
MCHC: 32.4 g/dL (ref 30.0–36.0)
MCV: 82.8 fL (ref 80.0–100.0)
Platelets: 182 10*3/uL (ref 150–400)
RBC: 4.93 MIL/uL (ref 3.87–5.11)
RDW: 15.5 % (ref 11.5–15.5)
WBC: 8.2 10*3/uL (ref 4.0–10.5)
nRBC: 0 % (ref 0.0–0.2)

## 2022-07-15 LAB — ABO/RH: ABO/RH(D): A POS

## 2022-07-15 LAB — PROTIME-INR
INR: 0.9 (ref 0.8–1.2)
Prothrombin Time: 12.3 seconds (ref 11.4–15.2)

## 2022-07-15 LAB — APTT: aPTT: 25 seconds (ref 24–36)

## 2022-07-15 LAB — HEPATIC FUNCTION PANEL
ALT: 156 U/L — ABNORMAL HIGH (ref 0–44)
AST: 181 U/L — ABNORMAL HIGH (ref 15–41)
Albumin: 4.1 g/dL (ref 3.5–5.0)
Alkaline Phosphatase: 71 U/L (ref 38–126)
Bilirubin, Direct: 0.4 mg/dL — ABNORMAL HIGH (ref 0.0–0.2)
Indirect Bilirubin: 0.7 mg/dL (ref 0.3–0.9)
Total Bilirubin: 1.1 mg/dL (ref 0.3–1.2)
Total Protein: 8 g/dL (ref 6.5–8.1)

## 2022-07-15 LAB — TROPONIN I (HIGH SENSITIVITY)
Troponin I (High Sensitivity): 6 ng/L (ref <18)
Troponin I (High Sensitivity): 8 ng/L (ref <18)

## 2022-07-15 LAB — LIPASE, BLOOD: Lipase: 37 U/L (ref 11–51)

## 2022-07-15 SURGERY — CHOLECYSTECTOMY, ROBOT-ASSISTED, LAPAROSCOPIC
Anesthesia: General | Site: Abdomen

## 2022-07-15 MED ORDER — LORAZEPAM 2 MG/ML IJ SOLN
1.0000 mg | Freq: Once | INTRAMUSCULAR | Status: AC
  Administered 2022-07-15: 1 mg via INTRAVENOUS
  Filled 2022-07-15: qty 1

## 2022-07-15 MED ORDER — MIDAZOLAM HCL 2 MG/2ML IJ SOLN
INTRAMUSCULAR | Status: DC | PRN
  Administered 2022-07-15: 2 mg via INTRAVENOUS

## 2022-07-15 MED ORDER — FENTANYL CITRATE (PF) 100 MCG/2ML IJ SOLN
INTRAMUSCULAR | Status: AC
  Filled 2022-07-15: qty 2

## 2022-07-15 MED ORDER — INDOCYANINE GREEN 25 MG IV SOLR: 1.2500 mg | Freq: Once | INTRAVENOUS | Status: DC

## 2022-07-15 MED ORDER — BUPIVACAINE-EPINEPHRINE (PF) 0.5% -1:200000 IJ SOLN
INTRAMUSCULAR | Status: DC | PRN
  Administered 2022-07-15: 30 mL

## 2022-07-15 MED ORDER — SEVOFLURANE IN SOLN
RESPIRATORY_TRACT | Status: AC
  Filled 2022-07-15: qty 250

## 2022-07-15 MED ORDER — IOHEXOL 350 MG/ML SOLN
65.0000 mL | Freq: Once | INTRAVENOUS | Status: AC | PRN
  Administered 2022-07-15: 65 mL via INTRAVENOUS

## 2022-07-15 MED ORDER — ONDANSETRON HCL 4 MG/2ML IJ SOLN
4.0000 mg | Freq: Once | INTRAMUSCULAR | Status: AC
  Administered 2022-07-15: 4 mg via INTRAVENOUS
  Filled 2022-07-15: qty 2

## 2022-07-15 MED ORDER — MORPHINE SULFATE (PF) 4 MG/ML IV SOLN
4.0000 mg | Freq: Once | INTRAVENOUS | Status: AC
  Administered 2022-07-15: 4 mg via INTRAVENOUS
  Filled 2022-07-15: qty 1

## 2022-07-15 MED ORDER — ONDANSETRON HCL 4 MG/2ML IJ SOLN
INTRAMUSCULAR | Status: DC | PRN
  Administered 2022-07-15: 4 mg via INTRAVENOUS

## 2022-07-15 MED ORDER — ONDANSETRON 4 MG PO TBDP
4.0000 mg | ORAL_TABLET | Freq: Four times a day (QID) | ORAL | Status: DC | PRN
  Administered 2022-07-18: 4 mg via ORAL
  Filled 2022-07-15: qty 1

## 2022-07-15 MED ORDER — SUCCINYLCHOLINE CHLORIDE 200 MG/10ML IV SOSY
PREFILLED_SYRINGE | INTRAVENOUS | Status: DC | PRN
  Administered 2022-07-15: 120 mg via INTRAVENOUS

## 2022-07-15 MED ORDER — LACTATED RINGERS IV SOLN: INTRAVENOUS | Status: DC | PRN

## 2022-07-15 MED ORDER — PROMETHAZINE HCL 25 MG/ML IJ SOLN: 6.2500 mg | INTRAMUSCULAR | Status: DC | PRN

## 2022-07-15 MED ORDER — SUGAMMADEX SODIUM 500 MG/5ML IV SOLN
INTRAVENOUS | Status: AC
  Filled 2022-07-15: qty 5

## 2022-07-15 MED ORDER — OXYCODONE HCL 5 MG PO TABS
5.0000 mg | ORAL_TABLET | Freq: Once | ORAL | Status: AC | PRN
  Administered 2022-07-15: 5 mg

## 2022-07-15 MED ORDER — PANTOPRAZOLE SODIUM 40 MG IV SOLR
40.0000 mg | Freq: Every day | INTRAVENOUS | Status: DC
  Administered 2022-07-15 – 2022-07-17 (×3): 40 mg via INTRAVENOUS
  Filled 2022-07-15 (×4): qty 10

## 2022-07-15 MED ORDER — PROPOFOL 10 MG/ML IV BOLUS
INTRAVENOUS | Status: AC
  Filled 2022-07-15: qty 20

## 2022-07-15 MED ORDER — ROCURONIUM BROMIDE 100 MG/10ML IV SOLN
INTRAVENOUS | Status: DC | PRN
  Administered 2022-07-15: 50 mg via INTRAVENOUS
  Administered 2022-07-15: 20 mg via INTRAVENOUS

## 2022-07-15 MED ORDER — ACETAMINOPHEN 10 MG/ML IV SOLN
INTRAVENOUS | Status: DC | PRN
  Administered 2022-07-15: 1000 mg via INTRAVENOUS

## 2022-07-15 MED ORDER — KETOROLAC TROMETHAMINE 30 MG/ML IJ SOLN
INTRAMUSCULAR | Status: AC
  Filled 2022-07-15: qty 1

## 2022-07-15 MED ORDER — OXYCODONE HCL 5 MG/5ML PO SOLN: 5.0000 mg | Freq: Once | ORAL | Status: AC | PRN

## 2022-07-15 MED ORDER — ACETAMINOPHEN 650 MG RE SUPP: 650.0000 mg | Freq: Four times a day (QID) | RECTAL | Status: DC | PRN

## 2022-07-15 MED ORDER — ONDANSETRON HCL 4 MG/2ML IJ SOLN: 4.0000 mg | Freq: Four times a day (QID) | INTRAMUSCULAR | Status: DC | PRN

## 2022-07-15 MED ORDER — FENTANYL CITRATE (PF) 100 MCG/2ML IJ SOLN
INTRAMUSCULAR | Status: DC | PRN
  Administered 2022-07-15 (×3): 50 ug via INTRAVENOUS

## 2022-07-15 MED ORDER — BUPIVACAINE-EPINEPHRINE (PF) 0.5% -1:200000 IJ SOLN
INTRAMUSCULAR | Status: AC
  Filled 2022-07-15: qty 30

## 2022-07-15 MED ORDER — ENOXAPARIN SODIUM 60 MG/0.6ML IJ SOSY
0.5000 mg/kg | PREFILLED_SYRINGE | INTRAMUSCULAR | Status: DC
  Administered 2022-07-16 – 2022-07-17 (×2): 57.5 mg via SUBCUTANEOUS
  Filled 2022-07-15 (×2): qty 0.6

## 2022-07-15 MED ORDER — OXYCODONE HCL 5 MG PO TABS
ORAL_TABLET | ORAL | Status: AC
  Filled 2022-07-15: qty 1

## 2022-07-15 MED ORDER — HYDROCODONE-ACETAMINOPHEN 5-325 MG PO TABS
1.0000 | ORAL_TABLET | ORAL | Status: DC | PRN
  Administered 2022-07-16 – 2022-07-19 (×10): 2 via ORAL
  Filled 2022-07-15 (×10): qty 2

## 2022-07-15 MED ORDER — MORPHINE SULFATE (PF) 4 MG/ML IV SOLN
4.0000 mg | INTRAVENOUS | Status: DC | PRN
  Administered 2022-07-16 – 2022-07-18 (×7): 4 mg via INTRAVENOUS
  Filled 2022-07-15 (×7): qty 1

## 2022-07-15 MED ORDER — SUGAMMADEX SODIUM 500 MG/5ML IV SOLN
INTRAVENOUS | Status: DC | PRN
  Administered 2022-07-15: 250 mg via INTRAVENOUS

## 2022-07-15 MED ORDER — MIDAZOLAM HCL 2 MG/2ML IJ SOLN
INTRAMUSCULAR | Status: AC
  Filled 2022-07-15: qty 2

## 2022-07-15 MED ORDER — ACETAMINOPHEN 10 MG/ML IV SOLN
INTRAVENOUS | Status: AC
  Filled 2022-07-15: qty 100

## 2022-07-15 MED ORDER — ONDANSETRON HCL 4 MG/2ML IJ SOLN
INTRAMUSCULAR | Status: AC
  Filled 2022-07-15: qty 2

## 2022-07-15 MED ORDER — DEXAMETHASONE SODIUM PHOSPHATE 10 MG/ML IJ SOLN
INTRAMUSCULAR | Status: DC | PRN
  Administered 2022-07-15: 10 mg via INTRAVENOUS

## 2022-07-15 MED ORDER — KETOROLAC TROMETHAMINE 30 MG/ML IJ SOLN
INTRAMUSCULAR | Status: DC | PRN
  Administered 2022-07-15: 30 mg via INTRAVENOUS

## 2022-07-15 MED ORDER — PHENYLEPHRINE 80 MCG/ML (10ML) SYRINGE FOR IV PUSH (FOR BLOOD PRESSURE SUPPORT)
PREFILLED_SYRINGE | INTRAVENOUS | Status: DC | PRN
  Administered 2022-07-15: 160 ug via INTRAVENOUS

## 2022-07-15 MED ORDER — HYDROCODONE-ACETAMINOPHEN 5-325 MG PO TABS
ORAL_TABLET | ORAL | Status: AC
  Filled 2022-07-15: qty 2

## 2022-07-15 MED ORDER — DROPERIDOL 2.5 MG/ML IJ SOLN: 0.6250 mg | Freq: Once | INTRAMUSCULAR | Status: DC | PRN

## 2022-07-15 MED ORDER — 0.9 % SODIUM CHLORIDE (POUR BTL) OPTIME
TOPICAL | Status: DC | PRN
  Administered 2022-07-15: 500 mL

## 2022-07-15 MED ORDER — ENOXAPARIN SODIUM 40 MG/0.4ML IJ SOSY: 40.0000 mg | PREFILLED_SYRINGE | INTRAMUSCULAR | Status: DC

## 2022-07-15 MED ORDER — LIDOCAINE HCL (CARDIAC) PF 100 MG/5ML IV SOSY
PREFILLED_SYRINGE | INTRAVENOUS | Status: DC | PRN
  Administered 2022-07-15: 100 mg via INTRAVENOUS

## 2022-07-15 MED ORDER — INDOCYANINE GREEN 25 MG IV SOLR
INTRAVENOUS | Status: DC | PRN
  Administered 2022-07-15: 2.5 mg via INTRAVENOUS

## 2022-07-15 MED ORDER — PROPOFOL 10 MG/ML IV BOLUS
INTRAVENOUS | Status: DC | PRN
  Administered 2022-07-15: 160 mg via INTRAVENOUS

## 2022-07-15 MED ORDER — PIPERACILLIN-TAZOBACTAM 3.375 G IVPB
3.3750 g | Freq: Three times a day (TID) | INTRAVENOUS | Status: DC
  Administered 2022-07-15 – 2022-07-17 (×6): 3.375 g via INTRAVENOUS
  Filled 2022-07-15 (×6): qty 50

## 2022-07-15 MED ORDER — CEFAZOLIN SODIUM-DEXTROSE 2-3 GM-%(50ML) IV SOLR
INTRAVENOUS | Status: DC | PRN
  Administered 2022-07-15: 2 g via INTRAVENOUS

## 2022-07-15 MED ORDER — FENTANYL CITRATE (PF) 100 MCG/2ML IJ SOLN
25.0000 ug | INTRAMUSCULAR | Status: DC | PRN
  Administered 2022-07-15: 50 ug via INTRAVENOUS

## 2022-07-15 MED ORDER — ACETAMINOPHEN 10 MG/ML IV SOLN
1000.0000 mg | Freq: Once | INTRAVENOUS | Status: DC | PRN
Start: 2022-07-15 — End: 2022-07-15

## 2022-07-15 MED ORDER — BUPIVACAINE-EPINEPHRINE (PF) 0.25% -1:200000 IJ SOLN
INTRAMUSCULAR | Status: AC
  Filled 2022-07-15: qty 30

## 2022-07-15 MED ORDER — METOCLOPRAMIDE HCL 5 MG/ML IJ SOLN: 10.0000 mg | Freq: Once | INTRAMUSCULAR | Status: DC

## 2022-07-15 MED ORDER — LIDOCAINE HCL (PF) 2 % IJ SOLN
INTRAMUSCULAR | Status: AC
  Filled 2022-07-15: qty 5

## 2022-07-15 MED ORDER — ACETAMINOPHEN 325 MG PO TABS
650.0000 mg | ORAL_TABLET | Freq: Four times a day (QID) | ORAL | Status: DC | PRN
  Administered 2022-07-16: 650 mg via ORAL
  Filled 2022-07-15: qty 2

## 2022-07-15 SURGICAL SUPPLY — 56 items
ADH SKN CLS APL DERMABOND .7 (GAUZE/BANDAGES/DRESSINGS) ×1
BAG PRESSURE INF REUSE 1000 (BAG) ×1 IMPLANT
BLADE SURG SZ11 CARB STEEL (BLADE) ×3 IMPLANT
CANNULA REDUC XI 12-8 STAPL (CANNULA) ×2
CANNULA REDUCER 12-8 DVNC XI (CANNULA) ×2 IMPLANT
CATH REDDICK CHOLANGI 4FR 50CM (CATHETERS) IMPLANT
CLIP LIGATING HEM O LOK PURPLE (MISCELLANEOUS) IMPLANT
CLIP LIGATING HEMO O LOK GREEN (MISCELLANEOUS) ×3 IMPLANT
DERMABOND ADVANCED (GAUZE/BANDAGES/DRESSINGS) ×1
DERMABOND ADVANCED .7 DNX12 (GAUZE/BANDAGES/DRESSINGS) ×2 IMPLANT
DRAPE ARM DVNC X/XI (DISPOSABLE) ×8 IMPLANT
DRAPE C-ARM XRAY 36X54 (DRAPES) IMPLANT
DRAPE COLUMN DVNC XI (DISPOSABLE) ×2 IMPLANT
DRAPE DA VINCI XI ARM (DISPOSABLE) ×8
DRAPE DA VINCI XI COLUMN (DISPOSABLE) ×2
ELECT REM PT RETURN 9FT ADLT (ELECTROSURGICAL) ×2
ELECTRODE REM PT RTRN 9FT ADLT (ELECTROSURGICAL) ×2 IMPLANT
GLOVE BIO SURGEON STRL SZ 6.5 (GLOVE) ×8 IMPLANT
GLOVE BIOGEL PI IND STRL 6.5 (GLOVE) ×4 IMPLANT
GLOVE BIOGEL PI INDICATOR 6.5 (GLOVE) ×4
GOWN STRL REUS W/ TWL LRG LVL3 (GOWN DISPOSABLE) ×6 IMPLANT
GOWN STRL REUS W/TWL LRG LVL3 (GOWN DISPOSABLE) ×8
GRASPER SUT TROCAR 14GX15 (MISCELLANEOUS) ×3 IMPLANT
IRRIGATOR SUCT 8 DISP DVNC XI (IRRIGATION / IRRIGATOR) IMPLANT
IRRIGATOR SUCTION 8MM XI DISP (IRRIGATION / IRRIGATOR) ×2
IV CATH ANGIO 12GX3 LT BLUE (NEEDLE) IMPLANT
IV NS 1000ML (IV SOLUTION) ×2
IV NS 1000ML BAXH (IV SOLUTION) IMPLANT
KIT PINK PAD W/HEAD ARE REST (MISCELLANEOUS) ×2 IMPLANT
KIT PINK PAD W/HEAD ARM REST (MISCELLANEOUS) ×2 IMPLANT
LABEL OR SOLS (LABEL) ×3 IMPLANT
MANIFOLD NEPTUNE II (INSTRUMENTS) ×3 IMPLANT
NDL INSUFFLATION 14GA 120MM (NEEDLE) ×2 IMPLANT
NEEDLE HYPO 22GX1.5 SAFETY (NEEDLE) ×3 IMPLANT
NEEDLE INSUFFLATION 14GA 120MM (NEEDLE) ×2 IMPLANT
NS IRRIG 500ML POUR BTL (IV SOLUTION) ×3 IMPLANT
OBTURATOR OPTICAL STANDARD 8MM (TROCAR) ×2
OBTURATOR OPTICAL STND 8 DVNC (TROCAR) ×1
OBTURATOR OPTICALSTD 8 DVNC (TROCAR) ×2 IMPLANT
PACK LAP CHOLECYSTECTOMY (MISCELLANEOUS) ×3 IMPLANT
SEAL CANN UNIV 5-8 DVNC XI (MISCELLANEOUS) ×6 IMPLANT
SEAL XI 5MM-8MM UNIVERSAL (MISCELLANEOUS) ×6
SET TUBE SMOKE EVAC HIGH FLOW (TUBING) ×3 IMPLANT
SOLUTION ELECTROLUBE (MISCELLANEOUS) ×3 IMPLANT
SPIKE FLUID TRANSFER (MISCELLANEOUS) ×6 IMPLANT
SPONGE T-LAP 4X18 ~~LOC~~+RFID (SPONGE) ×1 IMPLANT
STAPLER CANNULA SEAL DVNC XI (STAPLE) ×2 IMPLANT
STAPLER CANNULA SEAL XI (STAPLE) ×2
SUT MNCRL 4-0 (SUTURE) ×2
SUT MNCRL 4-0 27XMFL (SUTURE) ×1
SUT VICRYL 0 AB UR-6 (SUTURE) ×3 IMPLANT
SUTURE MNCRL 4-0 27XMF (SUTURE) ×2 IMPLANT
SYS BAG RETRIEVAL 10MM (BASKET) ×2
SYSTEM BAG RETRIEVAL 10MM (BASKET) ×2 IMPLANT
TRAP FLUID SMOKE EVACUATOR (MISCELLANEOUS) ×3 IMPLANT
WATER STERILE IRR 500ML POUR (IV SOLUTION) ×3 IMPLANT

## 2022-07-15 NOTE — Transfer of Care (Signed)
Immediate Anesthesia Transfer of Care Note  Patient: Jessica Johns  Procedure(s) Performed: XI ROBOTIC ASSISTED LAPAROSCOPIC CHOLECYSTECTOMY (Abdomen) INDOCYANINE GREEN FLUORESCENCE IMAGING (ICG) (Abdomen)  Patient Location: PACU  Anesthesia Type:General  Level of Consciousness: drowsy and patient cooperative  Airway & Oxygen Therapy: Patient Spontanous Breathing and Patient connected to face mask oxygen  Post-op Assessment: Report given to RN and Post -op Vital signs reviewed and stable  Post vital signs: Reviewed and stable  Last Vitals:  Vitals Value Taken Time  BP 169/78 07/15/22 2015  Temp 36.1 C 07/15/22 2008  Pulse 105 07/15/22 2018  Resp 20 07/15/22 2018  SpO2 99 % 07/15/22 2018  Vitals shown include unvalidated device data.  Last Pain:  Vitals:   07/15/22 2008  TempSrc:   PainSc: Asleep         Complications: No notable events documented.

## 2022-07-15 NOTE — ED Notes (Signed)
PT to OR with OR tech

## 2022-07-15 NOTE — ED Notes (Signed)
PIV attempted x1 with no success, MD aware, MD to place PIV with [place].

## 2022-07-15 NOTE — ED Notes (Signed)
Pt given remote, warm blanket and lights dimmed to help with comfort.

## 2022-07-15 NOTE — ED Notes (Addendum)
Pt is requesting more pain medication, pt has been educated on HIDA scan and that she needs to be NPO and that she cannot have any pain medications at this time due to the scan

## 2022-07-15 NOTE — ED Notes (Signed)
Report to nuclear med

## 2022-07-15 NOTE — Progress Notes (Signed)
PHARMACIST - PHYSICIAN COMMUNICATION  CONCERNING:  Enoxaparin (Lovenox) for DVT Prophylaxis    RECOMMENDATION: Patient was prescribed enoxaprin 40mg  q24 hours for VTE prophylaxis.   Filed Weights   07/15/22 0756  Weight: 116 kg (255 lb 11.7 oz)    Body mass index is 51.65 kg/m.  Estimated Creatinine Clearance: 73.3 mL/min (by C-G formula based on SCr of 0.99 mg/dL).   Based on [facility] policy patient is candidate for enoxaparin 0.5mg /kg TBW SQ every 24 hours based on BMI being >30.   DESCRIPTION: Pharmacy has adjusted enoxaparin dose per [facility] policy.  Patient is now receiving enoxaparin 0.5 mg/kg every 24 hours    [facility], PharmD Clinical Pharmacist  07/15/2022 8:01 PM

## 2022-07-15 NOTE — Anesthesia Preprocedure Evaluation (Addendum)
Anesthesia Evaluation  Patient identified by MRN, date of birth, ID band Patient awake    Reviewed: Allergy & Precautions, NPO status , Patient's Chart, lab work & pertinent test results  Airway Mallampati: II  TM Distance: >3 FB Neck ROM: full    Dental no notable dental hx.    Pulmonary shortness of breath,    Pulmonary exam normal        Cardiovascular  Rhythm:Regular Rate:Tachycardia  EKG SINUS TACHY   Neuro/Psych negative neurological ROS  negative psych ROS   GI/Hepatic GERD  ,Severe fatty liver changes seen on CT scan 07/15/2022 abdominal pain and chest pain acute cholecystitis   Endo/Other  Morbid obesity  Renal/GU      Musculoskeletal   Abdominal (+) + obese,  Abdomen: soft and tender.    Peds  Hematology negative hematology ROS (+)   Anesthesia Other Findings Chest pain work up with CTPE negative  Past Medical History: No date: GERD (gastroesophageal reflux disease)  Past Surgical History: No date: TUBAL LIGATION No date: WRIST SURGERY  BMI    Body Mass Index: 51.65 kg/m      Reproductive/Obstetrics negative OB ROS                           Anesthesia Physical Anesthesia Plan  ASA: 3  Anesthesia Plan: General ETT   Post-op Pain Management: Toradol IV (intra-op)* and Ofirmev IV (intra-op)*   Induction: Intravenous and Rapid sequence  PONV Risk Score and Plan: 4 or greater and Ondansetron, Dexamethasone, Midazolam and Metaclopromide  Airway Management Planned: Oral ETT  Additional Equipment:   Intra-op Plan:   Post-operative Plan: Extubation in OR  Informed Consent: I have reviewed the patients History and Physical, chart, labs and discussed the procedure including the risks, benefits and alternatives for the proposed anesthesia with the patient or authorized representative who has indicated his/her understanding and acceptance.     Dental Advisory  Given  Plan Discussed with: Anesthesiologist, CRNA and Surgeon  Anesthesia Plan Comments:       Anesthesia Quick Evaluation

## 2022-07-15 NOTE — Op Note (Signed)
Preoperative diagnosis: Acute cholecystitis  Postoperative diagnosis: Same  Procedure: Robotic Assisted Laparoscopic Cholecystectomy.   Anesthesia: GETA   Surgeon: Dr. Hazle Quant  Wound Classification: Clean Contaminated  Indications: Patient is a 56 y.o. female developed right upper quadrant pain, nausea and on workup was found to have cholelithiasis with a normal common duct with cholecystitis. Robotic Assisted Laparoscopic cholecystectomy was elected.  Findings: Severe distended and tense gallbladder Critical view of safety achieved Cystic duct and artery identified, ligated and divided Adequate hemostasis  Description of procedure: The patient was placed on the operating table in the supine position. General anesthesia was induced. A time-out was completed verifying correct patient, procedure, site, positioning, and implant(s) and/or special equipment prior to beginning this procedure. An orogastric tube was placed. The abdomen was prepped and draped in the usual sterile fashion.  An incision was made in a natural skin line below the umbilicus.  The fascia was elevated and the Veress needle inserted. Proper position was confirmed by aspiration and saline meniscus test.  The abdomen was insufflated with carbon dioxide to a pressure of 15 mmHg. The patient tolerated insufflation well. A 8-mm trocar was then inserted in optiview fashion.  The laparoscope was inserted and the abdomen inspected. No injuries from initial trocar placement were noted. Additional trocars were then inserted in the following locations: an 8-mm trocar in the left lateral abdomen, and another two 8-mm trocars to the right side of the abdomen 5 cm appart. The umbilical trocar was changed to a 12 mm trocar all under direct visualization. The abdomen was inspected and no abnormalities were found. The table was placed in the reverse Trendelenburg position with the right side up. The robotic arms were docked and target  anatomy identified. Instrument inserted under direct visualization.  Filmy adhesions between the gallbladder and omentum, duodenum and transverse colon were lysed with electrocautery. The gallbladder was found tense and unable to be grasped. The dome of the gallbladder was opened and bile suctioned to release the tension and able to be grasped. The dome of the gallbladder was grasped with a prograsp and retracted over the dome of the liver. The infundibulum was also grasped with an atraumatic grasper and retracted toward the right lower quadrant. This maneuver exposed Calot's triangle. The peritoneum overlying the gallbladder infundibulum was then incised and the cystic duct and cystic artery identified and circumferentially dissected. Critical view of safety reviewed before ligating any structure. Firefly images taken to visualize biliary ducts. The cystic duct and cystic artery were then doubly clipped and divided close to the gallbladder.  The gallbladder was then dissected from its peritoneal attachments by electrocautery. Hemostasis was checked and the gallbladder and contained stones were removed using an endoscopic retrieval bag. The gallbladder was passed off the table as a specimen. There was no evidence of bleeding from the gallbladder fossa or cystic artery or leakage of the bile from the cystic duct stump. Secondary trocars were removed under direct vision. No bleeding was noted. The robotic arms were undoked. The scope was withdrawn and the umbilical trocar removed. The abdomen was allowed to collapse. The fascia of the 64mm trocar sites was closed with figure-of-eight 0 vicryl sutures. The skin was closed with subcuticular sutures of 4-0 monocryl and topical skin adhesive. The orogastric tube was removed.  The patient tolerated the procedure well and was taken to the postanesthesia care unit in stable condition.   Specimen: Gallbladder  Complications: None  EBL: 5 mL

## 2022-07-15 NOTE — ED Triage Notes (Signed)
Pt to ED POV for left sided chest pain, n/v that started this am. Hx GERD, denies cardiac hx. +shob. Pt actively vomiting in lobby.

## 2022-07-15 NOTE — H&P (Signed)
SURGICAL CONSULTATION NOTE   HISTORY OF PRESENT ILLNESS (HPI):  56 y.o. female presented to [facility] ED for evaluation of abdominal pain and chest pain. Patient reports pain started earlier today at 8 AM.  Pain localized in the epigastric area.  Pain radiates to the right upper quadrant and right upper chest.  She thought that she was having a heart attack and decided to come to the ED.  There has been no alleviating or aggravating factors.  At the ED she was found with severe tenderness in the right upper quadrant on palpation.  There was mild tachycardia.  No fever.  Labs shows normal white blood cell count of 8.2.  CMP shows elevated AST/ALT with normal bilirubin and alkaline phosphatase.  Hepatic function panel shows decreasing trend of the AST/ALT with normal bilirubin.  MRCP shows a large stone at the gallbladder neck without sign of choledocholithiasis.  Abdominal ultrasound shows 2 cm stone stuck in the gallbladder neck.  Patient had positive Murphy sign.  I personally evaluated the images of the MRCP and abdominal ultrasound.  Normal common bile duct diameter.  Surgery is consulted by Dr. Quentin Cornwall in this context for evaluation and management of acute cholecystitis.  PAST MEDICAL HISTORY (PMH):  Past Medical History:  Diagnosis Date   GERD (gastroesophageal reflux disease)      PAST SURGICAL HISTORY ([place]):  Past Surgical History:  Procedure Laterality Date   TUBAL LIGATION     WRIST SURGERY       MEDICATIONS:  Prior to Admission medications   Medication Sig Start Date End Date Taking? Authorizing Provider  aspirin EC 325 MG tablet Take 650 mg by mouth 3 (three) times daily as needed for mild pain.   Yes [provider]  acetaminophen (TYLENOL) 500 MG tablet Take 1,500 mg by mouth 3 (three) times daily as needed.    [provider]  lidocaine (XYLOCAINE) 2 % solution Take 20 mLs by mouth every 6 (six) hours as needed. Patient not taking: Reported on 04/27/2015  03/24/13   Doree Albee, MD  oxyCODONE (OXY IR/ROXICODONE) 5 MG immediate release tablet Take 1 tablet (5 mg total) by mouth every 3 (three) hours as needed. Patient not taking: Reported on 04/27/2015 03/24/13   Doree Albee, MD  predniSONE (DELTASONE) 10 MG tablet Take 2 tablets (20 mg total) by mouth daily. Patient not taking: Reported on 08/10/2019 04/27/15   Milton Ferguson, MD  promethazine (PHENERGAN) 25 MG tablet Take 1 tablet (25 mg total) by mouth every 6 (six) hours as needed for nausea or vomiting. Patient not taking: Reported on 08/10/2019 04/27/15   Milton Ferguson, MD     ALLERGIES:  No Known Allergies   SOCIAL HISTORY:  Social History   Socioeconomic History   Marital status: Divorced    Spouse name: Not on file   Number of children: Not on file   Years of education: Not on file   Highest education level: Not on file  Occupational History   Not on file  Tobacco Use   Smoking status: Never   Smokeless tobacco: Not on file  Substance and Sexual Activity   Alcohol use: No   Drug use: No   Sexual activity: Yes    Birth control/protection: None  Other Topics Concern   Not on file  Social History Narrative   Not on file   Social Determinants of Health   Financial Resource Strain: Not on file  Food Insecurity: Not on file  Transportation Needs:  Not on file  Physical Activity: Not on file  Stress: Not on file  Social Connections: Not on file  Intimate Partner Violence: Not on file     FAMILY HISTORY:  Family History  Problem Relation Age of Onset   Breast cancer Maternal Grandmother    Breast cancer Paternal Grandmother      REVIEW OF SYSTEMS:  Constitutional: denies weight loss, fever, chills, or sweats  Eyes: denies any other vision changes, history of eye injury  ENT: denies sore throat, hearing problems  Respiratory: denies shortness of breath, wheezing  Cardiovascular: denies chest pain, palpitations  Gastrointestinal: positive abdominal pain,  nausea and vomitnig Genitourinary: denies burning with urination or urinary frequency Musculoskeletal: denies any other joint pains or cramps  Skin: denies any other rashes or skin discolorations  Neurological: denies any other headache, dizziness, weakness  Psychiatric: denies any other depression, anxiety   All other review of systems were negative   VITAL SIGNS:  Temp:  [98.3 F (36.8 C)] 98.3 F (36.8 C) (08/09 1240) Pulse Rate:  [79-103] 88 (08/09 1400) Resp:  [11-24] 20 (08/09 1400) BP: (144-186)/(63-81) 186/81 (08/09 1400) SpO2:  [93 %-100 %] 98 % (08/09 1400) Weight:  [237 kg] 116 kg (08/09 0756)     Height: 4\' 11"  (149.9 cm) Weight: 116 kg BMI (Calculated): 51.62   INTAKE/OUTPUT:  This shift: No intake/output data recorded.  Last 2 shifts: @IOLAST2SHIFTS @   PHYSICAL EXAM:  Constitutional:  -- Normal body habitus  -- Awake, alert, and oriented x3  Eyes:  -- Pupils equally round and reactive to light  -- No scleral icterus  Ear, nose, and throat:  -- No jugular venous distension  Pulmonary:  -- No crackles  -- Equal breath sounds bilaterally -- Breathing non-labored at rest Cardiovascular:  -- S1, S2 present  -- No pericardial rubs Gastrointestinal:  -- Abdomen soft, tender to palpation in the right upper quadrant with Murphy sign positive, non-distended, no guarding or rebound tenderness -- No abdominal masses appreciated, pulsatile or otherwise  Musculoskeletal and Integumentary:  -- Wounds: None appreciated -- Extremities: B/L UE and LE FROM, hands and feet warm, no edema  Neurologic:  -- Motor function: intact and symmetric -- Sensation: intact and symmetric   Labs:     Latest Ref Rng & Units 07/15/2022    8:02 AM 12/22/2019    2:42 AM 04/27/2015   12:02 PM  CBC  WBC 4.0 - 10.5 K/uL 8.2  11.6  8.6   Hemoglobin 12.0 - 15.0 g/dL 12/24/2019  04/29/2015  62.8   Hematocrit 36.0 - 46.0 % 40.8  37.4  38.0   Platelets 150 - 400 K/uL 182  217  200       Latest Ref Rng  & Units 07/15/2022    2:58 PM 07/15/2022    8:02 AM 12/22/2019    2:42 AM  CMP  Glucose 70 - 99 mg/dL  09/14/2022  12/24/2019   BUN 6 - 20 mg/dL  16  12   Creatinine 160 - 1.00 mg/dL  737  1.06   Sodium 2.69 - 145 mmol/L  140  141   Potassium 3.5 - 5.1 mmol/L  4.0  3.2   Chloride 98 - 111 mmol/L  107  105   CO2 22 - 32 mmol/L  25  28   Calcium 8.9 - 10.3 mg/dL  9.3  9.3   Total Protein 6.5 - 8.1 g/dL 8.0  7.9  7.1   Total Bilirubin 0.3 - 1.2  mg/dL 1.1  0.9  0.6   Alkaline Phos 38 - 126 U/L 71  74  65   AST 15 - 41 U/L 181  319  66   ALT 0 - 44 U/L 156  160  35     Imaging studies:  CLINICAL DATA:  Right upper quadrant abdominal pain. Elevated liver function tests.   EXAM: MRI ABDOMEN WITHOUT CONTRAST  (INCLUDING MRCP)   TECHNIQUE: Multiplanar multisequence MR imaging of the abdomen was performed. Heavily T2-weighted images of the biliary and pancreatic ducts were obtained, and three-dimensional MRCP images were rendered by post processing.   COMPARISON:  07/15/2022 abdominal ultrasound.   FINDINGS: Portions of exam are mildly motion degraded.   Lower chest: Normal heart size without pericardial or pleural effusion.   Hepatobiliary: Marked hepatic steatosis with areas of heterogeneity. These are primarily geographic, but there are more nodular areas of probable sparing at 3-4 mm in the high right hepatic lobe x2 on 24/6. No suspicious liver lesion. Central right hepatic lobe cyst measures 1.2 cm.   A stone in the gallbladder neck measures 2.3 cm on 21/4. No gallbladder wall thickening or pericholecystic edema.   No intra or extrahepatic biliary duct dilatation. Common duct measures 5 mm. No choledocholithiasis or obstructive mass.   Pancreas:  Normal, without mass or ductal dilatation.   Spleen:  Normal in size, without focal abnormality.   Adrenals/Urinary Tract: Normal adrenal glands. Normal kidneys, without hydronephrosis.   Stomach/Bowel: Proximal gastric underdistention.  Normal abdominal bowel loops.   Vascular/Lymphatic: Normal caliber of the aorta and branch vessels. No retroperitoneal or retrocrural adenopathy.   Other:  No ascites.   Musculoskeletal: No acute osseous abnormality.   IMPRESSION: 1. Mildly motion degraded exam. 2. Dominant stone within the gallbladder neck, without specific evidence of acute cholecystitis. 3. No biliary duct dilatation or choledocholithiasis. 4. Marked hepatic steatosis.     Electronically Signed   By: Jeronimo Greaves M.D.   On: 07/15/2022 17:11  Assessment/Plan:  56 y.o. female with acute cholecystitis, complicated by pertinent comorbidities including morbid obesity.  Patient with history, physical exam and images consistent with acute cholecystitis. Patient oriented about diagnosis and surgical management as treatment.   Discussed the risk of surgery including post-op infxn, seroma, biloma, chronic pain, poor-delayed wound healing, retained gallstone, conversion to open procedure, post-op SBO or ileus, and need for additional procedures to address said risks.  The risks of general anesthetic including MI, CVA, sudden death or even reaction to anesthetic medications also discussed. Alternatives include continued observation.  Benefits include possible symptom relief, prevention of complications including acute cholecystitis, pancreatitis.  Gae Gallop, MD

## 2022-07-15 NOTE — ED Notes (Signed)
Pt presents to ED with c/o of having a L sided CP that does not radiate. Pt states it woke her up at 0650 and pt states she then showered in hopes to feel better but states pain got worse, pt  states 1 episode of vomiting to arrival to ED. Pt denies cardiac HX at this time. Pt does present tearful due to pain. Pt does endorse she took 1 81 mg ASA PTA.   Pt states HX of GERD.

## 2022-07-15 NOTE — Anesthesia Procedure Notes (Addendum)
Procedure Name: Intubation Date/Time: 07/15/2022 6:22 PM  Performed by: Hezzie Bump, CRNAPre-anesthesia Checklist: Patient identified, Patient being monitored, Timeout performed, Emergency Drugs available and Suction available Patient Re-evaluated:Patient Re-evaluated prior to induction Oxygen Delivery Method: Circle system utilized Preoxygenation: Pre-oxygenation with 100% oxygen Induction Type: IV induction and Rapid sequence Laryngoscope Size: 3 and McGraph Grade View: Grade I Tube type: Oral Tube size: 7.0 mm Number of attempts: 1 Airway Equipment and Method: Stylet and Video-laryngoscopy Placement Confirmation: ETT inserted through vocal cords under direct vision, positive ETCO2 and breath sounds checked- equal and bilateral Secured at: 21 cm Tube secured with: Tape Dental Injury: Teeth and Oropharynx as per pre-operative assessment

## 2022-07-15 NOTE — ED Provider Notes (Signed)
[facility] Provider Note    Event Date/Time   First MD Initiated Contact with Patient 07/15/22 [id number]     (approximate)  History   Chief Complaint: Chest Pain  HPI  Jessica Johns is a 56 y.o. female with a past medical history of gastric reflux presents to the emergency department for chest pain or shortness of breath.  According to the patient she woke this morning from sleep with pain in the center of her chest and shortness of breath.  Patient states moderate to severe pain worse with deep inspiration.  No history of blood clots no history of cardiac disease or MI.  No family history known to the patient.  No estrogen or hormonal therapy.  During my evaluation patient appears uncomfortable complaining of midsternal chest pain.  Physical Exam   Triage Vital Signs: ED Triage Vitals  Enc Vitals Group     BP 07/15/22 0755 (!) 161/63     Pulse Rate 07/15/22 0755 (!) 103     Resp 07/15/22 0755 20     Temp 07/15/22 0755 98.3 F (36.8 C)     Temp Source 07/15/22 0755 Oral     SpO2 07/15/22 0755 100 %     Weight 07/15/22 0756 255 lb 11.7 oz (116 kg)     Height 07/15/22 0756 4\' 11"  (1.499 m)     Head Circumference --      Peak Flow --      Pain Score 07/15/22 0756 8     Pain Loc --      Pain Edu? --      Excl. in GC? --     Most recent vital signs: Vitals:   07/15/22 0755  BP: (!) 161/63  Pulse: (!) 103  Resp: 20  Temp: 98.3 F (36.8 C)  SpO2: 100%    General: Awake, no distress.  CV:  Good peripheral perfusion.  Regular rate and rhythm around 100 bpm Resp:  Normal effort.  Equal breath sounds bilaterally.  Abd:  No distention.  Soft, nontender.  No rebound or guarding. Other:  Calf tenderness.   ED Results / Procedures / Treatments   EKG  EKG viewed and interpreted by myself shows sinus tachycardia 103 bpm with a narrow QRS, normal axis, normal intervals, nonspecific ST changes.  RADIOLOGY  I personally viewed and interpreted the chest  x-ray images I do not see any obvious abnormality on my interpretation. Radiology is read the x-ray as negative. CTA is negative for acute abnormality  MEDICATIONS ORDERED IN ED: Medications  ondansetron (ZOFRAN) injection 4 mg (has no administration in time range)  morphine (PF) 4 MG/ML injection 4 mg (has no administration in time range)     IMPRESSION / MDM / ASSESSMENT AND PLAN / ED COURSE  I reviewed the triage vital signs and the nursing notes.  Patient's presentation is most consistent with acute presentation with potential threat to life or bodily function.  Patient presents emergency department for cute onset of chest pain since awakening this morning along with shortness of breath.  Patient did have 1 episode of vomiting in the waiting room.  Here the patient is very anxious in appearance complaining of chest pain and shortness of breath.  Differential is quite broad but would include ACS, pneumonia, pneumothorax, pulmonary embolism less likely dissection.  Vital signs are reassuring besides slight tachycardia.  We will check labs including cardiac enzymes we will obtain a CTA of the chest to rule out pulmonary  embolism we will treat pain nausea and continue to closely monitor.  Patient agreeable to plan of care.    No concerning findings on EKG or chest x-ray.  Labs and CTA pending. Patient's LFTs have resulted elevated.  On recheck/reevaluation patient does have right upper quadrant tenderness but also is complaining of pain throughout her chest and upper abdomen.  Ultrasound performed shows cholelithiasis without signs of acute cholecystitis besides positive Murphy sign.  We will proceed with a HIDA scan to further evaluate.  Patient agreeable to plan of care.  We will repeat a troponin as well.  Repeat troponin remains negative.  Lipase is normal.  CBC is normal.  Chemistry besides LFT elevation otherwise reassuring.  Patient continues to have chest and upper abdominal pain. HIDA    I spoke with Dr. Maia Plan of general surgery who would like an MRCP performed.  If the MRCP is negative for choledocholithiasis he states patient will likely need her gallbladder out.  He will consult on the patient.  Patient care signed out to oncoming provider.  FINAL CLINICAL IMPRESSION(S) / ED DIAGNOSES   Chest pain Abdominal pain Epigastric pain   Note:  This document was prepared using Dragon voice recognition software and may include unintentional dictation errors.   Minna Antis, MD 07/15/22 1537

## 2022-07-16 LAB — HEPATIC FUNCTION PANEL
ALT: 182 U/L — ABNORMAL HIGH (ref 0–44)
AST: 154 U/L — ABNORMAL HIGH (ref 15–41)
Albumin: 3.8 g/dL (ref 3.5–5.0)
Alkaline Phosphatase: 63 U/L (ref 38–126)
Bilirubin, Direct: <0.1 mg/dL (ref 0.0–0.2)
Total Bilirubin: 0.7 mg/dL (ref 0.3–1.2)
Total Protein: 7.9 g/dL (ref 6.5–8.1)

## 2022-07-16 MED ORDER — METHOCARBAMOL 500 MG PO TABS
500.0000 mg | ORAL_TABLET | Freq: Three times a day (TID) | ORAL | Status: DC | PRN
Start: 2022-07-16 — End: 2022-07-17
  Administered 2022-07-16: 500 mg via ORAL
  Filled 2022-07-16: qty 1

## 2022-07-16 MED ORDER — KETOROLAC TROMETHAMINE 30 MG/ML IJ SOLN
30.0000 mg | Freq: Three times a day (TID) | INTRAMUSCULAR | Status: DC
Start: 2022-07-16 — End: 2022-07-19
  Administered 2022-07-16 – 2022-07-18 (×7): 30 mg via INTRAVENOUS
  Filled 2022-07-16 (×8): qty 1

## 2022-07-16 NOTE — Progress Notes (Signed)
Mobility Specialist - Progress Note     07/16/22 0939  Mobility  Activity Ambulated independently to bathroom  Level of Assistance Independent  Assistive Device None  Distance Ambulated (ft) 8 ft  Activity Response Tolerated well  $Mobility charge 1 Mobility   Pt supine upon entry. Pt voiced concerns of consistent lower left abd pain, nurse notified. Pt ambulated to bathroom. Pt left supine with needs in reach.   Zetta Bills Mobility Specialist 07/16/22 9:42 AM

## 2022-07-16 NOTE — Progress Notes (Signed)
Pt ambulated 45ft with standby assist; tolerated well.

## 2022-07-16 NOTE — Progress Notes (Signed)
Patient ID: Jessica Johns, female   DOB: December 04, 1966, 56 y.o.   MRN: [id number]     SURGICAL PROGRESS NOTE   Hospital Day(s): 0.   Interval History: Patient seen and examined, no acute events or new complaints overnight. Patient reports having significant pain on the left side of the abdomen.  Pain described as 8 out of 10.  Currently not controlled with BP medications.  Tolerating diet.  Patient has been ambulating adequately yet.  Vital signs in last 24 hours: [min-max] current  Temp:  [97 F (36.1 C)-98.9 F (37.2 C)] 98.4 F (36.9 C) (08/10 0820) Pulse Rate:  [86-103] 86 (08/10 0820) Resp:  [12-26] 17 (08/10 0820) BP: (120-186)/(59-92) 135/59 (08/10 0820) SpO2:  [93 %-100 %] 99 % (08/10 0820)     Height: 4\' 11"  (149.9 cm) Weight: 116 kg BMI (Calculated): 51.62   Physical Exam:  Constitutional: alert, cooperative and no distress  Respiratory: breathing non-labored at rest  Cardiovascular: regular rate and sinus rhythm  Gastrointestinal: soft, non-tender, and non-distended  Labs:     Latest Ref Rng & Units 07/15/2022    8:02 AM 12/22/2019    2:42 AM 04/27/2015   12:02 PM  CBC  WBC 4.0 - 10.5 K/uL 8.2  11.6  8.6   Hemoglobin 12.0 - 15.0 g/dL 04/29/2015  75.6  43.3   Hematocrit 36.0 - 46.0 % 40.8  37.4  38.0   Platelets 150 - 400 K/uL 182  217  200       Latest Ref Rng & Units 07/16/2022   11:49 AM 07/15/2022    2:58 PM 07/15/2022    8:02 AM  CMP  Glucose 70 - 99 mg/dL   09/14/2022   BUN 6 - 20 mg/dL   16   Creatinine 188 - 1.00 mg/dL   4.16   Sodium 6.06 - 301 mmol/L   140   Potassium 3.5 - 5.1 mmol/L   4.0   Chloride 98 - 111 mmol/L   107   CO2 22 - 32 mmol/L   25   Calcium 8.9 - 10.3 mg/dL   9.3   Total Protein 6.5 - 8.1 g/dL 7.9  8.0  7.9   Total Bilirubin 0.3 - 1.2 mg/dL 0.7  1.1  0.9   Alkaline Phos 38 - 126 U/L 63  71  74   AST 15 - 41 U/L 154  181  319   ALT 0 - 44 U/L 182  156  160     Imaging studies: No new pertinent imaging studies   Assessment/Plan:  56 y.o. female  with acute cholecystitis 1 Day Post-Op s/p robotic cholecystectomy.  -Patient with significant pain unable to completely control with IV pain medications.  I will optimize pain regimen with Toradol and muscle relaxer.  I will also add heating pads and alternate with ice packs -I encouraged the patient to ambulate -Hepatic function panel with decreasing bilirubin and stable AST/ALT.  This goes against any retained stone. -I discussed with patient that left abdominal pain most likely from incisions.  The goal is to control it better with oral pain medications.  Hopefully he should be better controlled for tomorrow to be able to be discharged.  53, MD

## 2022-07-16 NOTE — TOC Initial Note (Signed)
Transition of Care [facility] - Initial/Assessment Note    Patient Details  Name: Jessica Johns MRN: [id number] Date of Birth: [birth year]  Transition of Care [facility]) CM/SW Contact:    Chapman Fitch, RN Phone Number: 07/16/2022, 11:29 AM  Clinical Narrative:                     Transition of Care [facility]) Screening Note   Patient Details  Name: Jessica Johns Date of Birth: November 04, 1966   Transition of Care [facility]) CM/SW Contact:    Chapman Fitch, RN Phone Number: 07/16/2022, 11:29 AM    Transition of Care Department [facility]) has reviewed patient and no TOC needs have been identified at this time. We will continue to monitor patient advancement through interdisciplinary progression rounds. If new patient transition needs arise, please place a TOC consult.  PCP Muse, patient ambulating with mobility tech Please consult TOCif medication assistance needed at discharge      Patient Goals and CMS Choice        Expected Discharge Plan and Services                                                Prior Living Arrangements/Services                       Activities of Daily Living Home Assistive Devices/Equipment: None ADL Screening (condition at time of admission) Patient's cognitive ability adequate to safely complete daily activities?: Yes Is the patient deaf or have difficulty hearing?: No Does the patient have difficulty seeing, even when wearing glasses/contacts?: No Does the patient have difficulty concentrating, remembering, or making decisions?: No Patient able to express need for assistance with ADLs?: Yes Does the patient have difficulty dressing or bathing?: No Independently performs ADLs?: Yes (appropriate for developmental age) Does the patient have difficulty walking or climbing stairs?: No Weakness of Legs: None Weakness of Arms/Hands: None  Permission Sought/Granted                  Emotional Assessment               Admission diagnosis:  Acute cholecystitis [K81.0] Pain of upper abdomen [R10.10] Chest pain, unspecified type [R07.9] Patient Active Problem List   Diagnosis Date Noted   Acute cholecystitis 07/15/2022   H/O tubal ligation 08/10/2019   Obesity, unspecified 03/21/2013   S/P tooth extraction 03/17/2013   PCP:  Tylene Fantasia., PA-C Pharmacy:   [facility] [place], [place] - [address] [place] [address] Phone: [phone] Fax: [phone]     Social Determinants of Health (SDOH) Interventions    Readmission Risk Interventions     No data to display

## 2022-07-17 ENCOUNTER — Observation Stay: Payer: Self-pay

## 2022-07-17 LAB — SURGICAL PATHOLOGY

## 2022-07-17 MED ORDER — IOHEXOL 300 MG/ML  SOLN
100.0000 mL | Freq: Once | INTRAMUSCULAR | Status: AC | PRN
  Administered 2022-07-17: 100 mL via INTRAVENOUS

## 2022-07-17 MED ORDER — METHOCARBAMOL 500 MG PO TABS
500.0000 mg | ORAL_TABLET | Freq: Three times a day (TID) | ORAL | Status: DC
  Administered 2022-07-17 – 2022-07-19 (×6): 500 mg via ORAL
  Filled 2022-07-17 (×6): qty 1

## 2022-07-17 MED ORDER — MENTHOL 3 MG MT LOZG
1.0000 | LOZENGE | OROMUCOSAL | Status: DC | PRN
  Filled 2022-07-17: qty 9

## 2022-07-17 MED ORDER — IOHEXOL 9 MG/ML PO SOLN
500.0000 mL | ORAL | Status: AC
  Administered 2022-07-17 (×2): 500 mL via ORAL

## 2022-07-17 NOTE — Anesthesia Postprocedure Evaluation (Signed)
Anesthesia Post Note  Patient: Jessica Johns  Procedure(s) Performed: XI ROBOTIC ASSISTED LAPAROSCOPIC CHOLECYSTECTOMY (Abdomen) INDOCYANINE GREEN FLUORESCENCE IMAGING (ICG) (Abdomen)  Patient location during evaluation: PACU Anesthesia Type: General Level of consciousness: awake and alert Pain management: pain level controlled Vital Signs Assessment: post-procedure vital signs reviewed and stable Respiratory status: spontaneous breathing, nonlabored ventilation and respiratory function stable Cardiovascular status: blood pressure returned to baseline and stable Postop Assessment: no apparent nausea or vomiting Anesthetic complications: no   No notable events documented.   Last Vitals:  Vitals:   07/17/22 0349 07/17/22 0852  BP: (!) 99/50 126/62  Pulse: 82 92  Resp: 20 14  Temp: 36.8 C 36.7 C  SpO2: 98% 98%    Last Pain:  Vitals:   07/17/22 1151  TempSrc:   PainSc: Asleep                 Foye Deer

## 2022-07-17 NOTE — Progress Notes (Signed)
Patient ID: Jessica Johns, female   DOB: 03/20/1966, 56 y.o.   MRN: [id number]     SURGICAL PROGRESS NOTE   Hospital Day(s): 0.   Interval History: Patient seen and examined, no acute events or new complaints overnight. Patient reports having severe pain in the left abdomen.  She endorses that the pain is unbearable.  Denies any nausea or vomiting.  Vital signs in last 24 hours: [min-max] current  Temp:  [98.1 F (36.7 C)-98.5 F (36.9 C)] 98.1 F (36.7 C) (08/11 0852) Pulse Rate:  [82-94] 92 (08/11 0852) Resp:  [14-20] 14 (08/11 0852) BP: (99-143)/(50-66) 126/62 (08/11 0852) SpO2:  [98 %-99 %] 98 % (08/11 0852)     Height: 4\' 11"  (149.9 cm) Weight: 116 kg BMI (Calculated): 51.62   Physical Exam:  Constitutional: alert, cooperative and no distress  Respiratory: breathing non-labored at rest  Cardiovascular: regular rate and sinus rhythm  Gastrointestinal: soft, non-tender, and non-distended  Labs:     Latest Ref Rng & Units 07/15/2022    8:02 AM 12/22/2019    2:42 AM 04/27/2015   12:02 PM  CBC  WBC 4.0 - 10.5 K/uL 8.2  11.6  8.6   Hemoglobin 12.0 - 15.0 g/dL 04/29/2015  41.2  87.8   Hematocrit 36.0 - 46.0 % 40.8  37.4  38.0   Platelets 150 - 400 K/uL 182  217  200       Latest Ref Rng & Units 07/16/2022   11:49 AM 07/15/2022    2:58 PM 07/15/2022    8:02 AM  CMP  Glucose 70 - 99 mg/dL   09/14/2022   BUN 6 - 20 mg/dL   16   Creatinine 720 - 1.00 mg/dL   9.47   Sodium 0.96 - 283 mmol/L   140   Potassium 3.5 - 5.1 mmol/L   4.0   Chloride 98 - 111 mmol/L   107   CO2 22 - 32 mmol/L   25   Calcium 8.9 - 10.3 mg/dL   9.3   Total Protein 6.5 - 8.1 g/dL 7.9  8.0  7.9   Total Bilirubin 0.3 - 1.2 mg/dL 0.7  1.1  0.9   Alkaline Phos 38 - 126 U/L 63  71  74   AST 15 - 41 U/L 154  181  319   ALT 0 - 44 U/L 182  156  160     Imaging studies: No new pertinent imaging studies   Assessment/Plan:  56 y.o. female with acute cholecystitis 2 Day Post-Op s/p robotic cholecystectomy.  Severe postop  pain -Pain has not improved with current pain medication regimen.  She has had morphine, hydrocodone, Toradol.  She has not been given the also relaxer. -I will change the muscle relaxer to standing instead of as needed -I will order CT scan of the abdomen due to pain out of proportion in the left abdomen. -We will keep her n.p.o. until pain improved -We will continue with the prophylaxis -We will continue with pain management -I encouraged the patient to ambulate   53, MD

## 2022-07-18 MED ORDER — KETOROLAC TROMETHAMINE 10 MG PO TABS: 10.0000 mg | ORAL_TABLET | Freq: Four times a day (QID) | ORAL | 0 refills | Status: AC | PRN

## 2022-07-18 MED ORDER — HYDROCODONE-ACETAMINOPHEN 5-325 MG PO TABS: 1.0000 | ORAL_TABLET | ORAL | 0 refills | Status: AC | PRN

## 2022-07-18 MED ORDER — METHOCARBAMOL 500 MG PO TABS: 500.0000 mg | ORAL_TABLET | Freq: Three times a day (TID) | ORAL | 0 refills | Status: AC

## 2022-07-18 NOTE — Progress Notes (Signed)
Patient ID: Jessica Johns, female   DOB: [birth year], 56 y.o.   MRN: [id number] Patient had discharge orders earlier this morning. During my evaluation of patient this morning she was feeling better with pain medications and heating pad. Later she developed nausea and her pain increased again to 8/10. Patient reported the nurse that she does not want to go home today.   On my evaluation, patient without fever, normal vital signs. Physical exam is benign with adequate healing wounds. Her pain is described mostly on the left lateral wound. No erythema, lump or fluid collection on wounds.   CT scan done yesterday shows no fluid in around the liver or around the incisions. Oral contrast reach the ascending colon. There is no bowel dilation or any sign of ileus or obstruction.   Labs has been within normal limits.   I think that described pain 8/10 despite having Norco, IV toradol, Robaxin and Morphine is due to low pain tolerance. Nausea most likely due to post anesthesia nausea.   Unable to discharge patient today as patient is not comfortable to go home.   Carolan Shiver, MD,FACS

## 2022-07-18 NOTE — Discharge Instructions (Signed)

## 2022-07-18 NOTE — Plan of Care (Signed)
  Problem: Health Behavior/Discharge Planning: Goal: Ability to manage health-related needs will improve Outcome: Progressing   

## 2022-07-18 NOTE — Discharge Summary (Signed)
  Patient ID: Jessica Johns MRN: [id number] DOB/AGE: [birth year] 56 y.o.  Admit date: 07/15/2022 Discharge date: 07/18/2022   Discharge Diagnoses:  Principal Problem:   Acute cholecystitis   Procedures:Robotic cholecystectomy   Hospital Course: Patient with acute cholecystitis. She underwent robotic cholecystectomy. Had difficulty controlling pain. CT of the abdomen yesterday with no complications. Today pain controlled with multimodal pain management. Patient tolerating diet and comfortable to go home today.   Physical Exam Vitals reviewed.  HENT:     Head: Normocephalic.  Cardiovascular:     Rate and Rhythm: Normal rate and regular rhythm.     Heart sounds: Normal heart sounds.  Abdominal:     General: Bowel sounds are normal.     Palpations: Abdomen is soft.  Skin:    General: Skin is warm.     Capillary Refill: Capillary refill takes less than 2 seconds.  Neurological:     General: No focal deficit present.     Mental Status: She is alert.   Wounds are dry and clean.    Consults: None  Disposition: Discharge disposition: 01-Home or Self Care       Discharge Instructions     Diet - low sodium heart healthy   Complete by: As directed    Increase activity slowly   Complete by: As directed       Allergies as of 07/18/2022   No Known Allergies      Medication List     TAKE these medications    acetaminophen 500 MG tablet Commonly known as: TYLENOL Take 1,500 mg by mouth 3 (three) times daily as needed.   aspirin EC 325 MG tablet Take 650 mg by mouth 3 (three) times daily as needed for mild pain.   HYDROcodone-acetaminophen 5-325 MG tablet Commonly known as: NORCO/VICODIN Take 1-2 tablets by mouth every 4 (four) hours as needed for moderate pain.   ketorolac 10 MG tablet Commonly known as: TORADOL Take 1 tablet (10 mg total) by mouth every 6 (six) hours as needed.   lidocaine 2 % solution Commonly known as: XYLOCAINE Take 20 mLs by mouth  every 6 (six) hours as needed.   methocarbamol 500 MG tablet Commonly known as: ROBAXIN Take 1 tablet (500 mg total) by mouth 3 (three) times daily.   naproxen 500 MG tablet Commonly known as: NAPROSYN Take 500 mg by mouth 2 (two) times daily.   oxyCODONE 5 MG immediate release tablet Commonly known as: Oxy IR/ROXICODONE Take 1 tablet (5 mg total) by mouth every 3 (three) hours as needed.   predniSONE 10 MG tablet Commonly known as: DELTASONE Take 2 tablets (20 mg total) by mouth daily.   promethazine 25 MG tablet Commonly known as: PHENERGAN Take 1 tablet (25 mg total) by mouth every 6 (six) hours as needed for nausea or vomiting.   traMADol 50 MG tablet Commonly known as: ULTRAM Take by mouth.        Follow-up Information     Carolan Shiver, MD Follow up in 2 week(s).   Specialty: General Surgery Why: follow up after cholecystectomy Contact information: [address] [place] [address] [phone]

## 2022-07-19 NOTE — Discharge Summary (Signed)
Patient ID: Jessica Johns MRN: [id number] DOB/AGE: 05/01/1966 56 y.o.  Admit date: 07/15/2022 Discharge date: 07/19/2022   Discharge Diagnoses:  Principal Problem:   Acute cholecystitis   Procedures: Robotic-assisted laparoscopic cholecystectomy  Hospital Course: Patient admitted with acute cholecystitis.  She underwent robotic cholecystectomy.  She has been recovering slowly.  She has been having difficulty with pain control.  Postoperative day #2 she had a CT scan of the abdomen and pelvis that shows no fluid collection and no sign of any complication from the surgery.  Pain mostly on the left abdominal incisions.  CT scan does not show any sign of infection, fluid collection or hernia on the left abdominal wall incisions.  She had discharge orders yesterday after I saw her in the morning and then she complaining of worsening pain and nausea.  She really wanted to leave.  Today the patient again is saying that she is comfortable to go now.  I will put the discharge orders again.  Physical Exam Vitals reviewed.  Cardiovascular:     Rate and Rhythm: Normal rate and regular rhythm.  Pulmonary:     Effort: Pulmonary effort is normal.     Breath sounds: Normal breath sounds.  Abdominal:     General: Bowel sounds are normal.     Palpations: Abdomen is soft.  Musculoskeletal:        General: Normal range of motion.  Skin:    General: Skin is warm.     Capillary Refill: Capillary refill takes less than 2 seconds.  Neurological:     General: No focal deficit present.     Mental Status: She is alert and oriented to person, place, and time.      Consults: None  Disposition: Discharge disposition: 01-Home or Self Care       Discharge Instructions     Diet - low sodium heart healthy   Complete by: As directed    Diet - low sodium heart healthy   Complete by: As directed    Increase activity slowly   Complete by: As directed    Increase activity slowly   Complete by: As  directed       Allergies as of 07/19/2022   No Known Allergies      Medication List     TAKE these medications    acetaminophen 500 MG tablet Commonly known as: TYLENOL Take 1,500 mg by mouth 3 (three) times daily as needed.   aspirin EC 325 MG tablet Take 650 mg by mouth 3 (three) times daily as needed for mild pain.   HYDROcodone-acetaminophen 5-325 MG tablet Commonly known as: NORCO/VICODIN Take 1-2 tablets by mouth every 4 (four) hours as needed for moderate pain.   ketorolac 10 MG tablet Commonly known as: TORADOL Take 1 tablet (10 mg total) by mouth every 6 (six) hours as needed.   lidocaine 2 % solution Commonly known as: XYLOCAINE Take 20 mLs by mouth every 6 (six) hours as needed.   methocarbamol 500 MG tablet Commonly known as: ROBAXIN Take 1 tablet (500 mg total) by mouth 3 (three) times daily.   naproxen 500 MG tablet Commonly known as: NAPROSYN Take 500 mg by mouth 2 (two) times daily.   oxyCODONE 5 MG immediate release tablet Commonly known as: Oxy IR/ROXICODONE Take 1 tablet (5 mg total) by mouth every 3 (three) hours as needed.   predniSONE 10 MG tablet Commonly known as: DELTASONE Take 2 tablets (20 mg total) by mouth daily.   promethazine  25 MG tablet Commonly known as: PHENERGAN Take 1 tablet (25 mg total) by mouth every 6 (six) hours as needed for nausea or vomiting.   traMADol 50 MG tablet Commonly known as: ULTRAM Take by mouth.        Follow-up Information     Carolan Shiver, MD Follow up in 2 week(s).   Specialty: General Surgery Why: follow up after cholecystectomy Contact information: [address] [place] [address] [phone]

## 2022-09-25 ENCOUNTER — Ambulatory Visit (HOSPITAL_COMMUNITY): Payer: PRIVATE HEALTH INSURANCE

## 2022-09-30 ENCOUNTER — Ambulatory Visit (HOSPITAL_COMMUNITY): Payer: PRIVATE HEALTH INSURANCE

## 2022-11-09 ENCOUNTER — Ambulatory Visit (LOCAL_COMMUNITY_HEALTH_CENTER): Payer: Self-pay

## 2022-11-09 DIAGNOSIS — Z111 Encounter for screening for respiratory tuberculosis: Secondary | ICD-10-CM

## 2022-11-12 ENCOUNTER — Ambulatory Visit (LOCAL_COMMUNITY_HEALTH_CENTER): Payer: Self-pay

## 2022-11-12 DIAGNOSIS — Z111 Encounter for screening for respiratory tuberculosis: Secondary | ICD-10-CM

## 2022-11-12 LAB — TB SKIN TEST
Induration: 0 mm
TB Skin Test: NEGATIVE

## 2023-08-11 ENCOUNTER — Telehealth: Payer: Self-pay

## 2023-08-11 NOTE — Telephone Encounter (Signed)
Attempted follow up call and wellness check to [facility] client/[facility] client. Last seen at [facility] on 08/07/22 no future appointments. No answer, left message.   Francee Nodal RN [employer]
